# Patient Record
Sex: Female | Born: 1960
Health system: Southern US, Community
[De-identification: ages and names within clinical notes are randomized; demographics above are authoritative.]

## PROBLEM LIST (undated history)

## (undated) DIAGNOSIS — F329 Major depressive disorder, single episode, unspecified: Secondary | ICD-10-CM

## (undated) DIAGNOSIS — F32A Depression, unspecified: Secondary | ICD-10-CM

## (undated) DIAGNOSIS — E7800 Pure hypercholesterolemia, unspecified: Secondary | ICD-10-CM

## (undated) DIAGNOSIS — F99 Mental disorder, not otherwise specified: Secondary | ICD-10-CM

## (undated) DIAGNOSIS — D649 Anemia, unspecified: Secondary | ICD-10-CM

## (undated) DIAGNOSIS — M1990 Unspecified osteoarthritis, unspecified site: Secondary | ICD-10-CM

## (undated) HISTORY — DX: Pure hypercholesterolemia, unspecified: E78.00

## (undated) HISTORY — DX: Anemia, unspecified: D64.9

## (undated) HISTORY — PX: HERNIA REPAIR: SHX51

## (undated) HISTORY — PX: ENDOMETRIAL ABLATION: SHX621

## (undated) HISTORY — DX: Mental disorder, not otherwise specified: F99

---

## 2017-11-06 DIAGNOSIS — R109 Unspecified abdominal pain: Secondary | ICD-10-CM | POA: Diagnosis not present

## 2017-11-19 ENCOUNTER — Other Ambulatory Visit (HOSPITAL_COMMUNITY)
Admission: RE | Admit: 2017-11-19 | Discharge: 2017-11-19 | Disposition: A | Discharge status: Home / Self Care | Payer: BLUE CROSS/BLUE SHIELD | Source: Ambulatory Visit | Attending: Obstetrics & Gynecology | Admitting: Obstetrics & Gynecology

## 2017-11-19 ENCOUNTER — Other Ambulatory Visit: Payer: Self-pay | Admitting: Obstetrics & Gynecology

## 2017-11-19 ENCOUNTER — Ambulatory Visit: Payer: BLUE CROSS/BLUE SHIELD | Admitting: Obstetrics & Gynecology

## 2017-11-19 ENCOUNTER — Encounter: Payer: Self-pay | Admitting: Obstetrics & Gynecology

## 2017-11-19 ENCOUNTER — Other Ambulatory Visit: Payer: Self-pay

## 2017-11-19 ENCOUNTER — Encounter (INDEPENDENT_AMBULATORY_CARE_PROVIDER_SITE_OTHER): Payer: Self-pay

## 2017-11-19 VITALS — BP 135/71 | HR 88 | Ht 64.0 in | Wt 260.0 lb

## 2017-11-19 DIAGNOSIS — N924 Excessive bleeding in the premenopausal period: Secondary | ICD-10-CM

## 2017-11-19 DIAGNOSIS — N939 Abnormal uterine and vaginal bleeding, unspecified: Secondary | ICD-10-CM | POA: Diagnosis not present

## 2017-11-19 DIAGNOSIS — N938 Other specified abnormal uterine and vaginal bleeding: Secondary | ICD-10-CM | POA: Diagnosis not present

## 2017-11-19 DIAGNOSIS — Z124 Encounter for screening for malignant neoplasm of cervix: Secondary | ICD-10-CM | POA: Insufficient documentation

## 2017-11-19 LAB — POCT HEMOGLOBIN: Hemoglobin: 12.9 g/dL (ref 12.2–16.2)

## 2017-11-19 MED ORDER — MEGESTROL ACETATE 40 MG PO TABS: ORAL_TABLET | ORAL | 3 refills | Status: DC

## 2017-11-19 NOTE — Addendum Note (Signed)
Addended by: Gaylyn Rong A on: 11/19/2017 11:57 AM   Modules accepted: Orders

## 2017-11-19 NOTE — Progress Notes (Signed)
Chief Complaint  Patient presents with  . Vaginal Bleeding    for 1.27yrs      57 y.o. G3P3 No LMP recorded. Patient has had an ablation. The current method of family planning is tubal ligation.  Outpatient Encounter Medications as of 11/19/2017  Medication Sig  . BELVIQ 10 MG TABS Take 1 tablet by mouth daily.  . ferrous sulfate 325 (65 FE) MG tablet Take 325 mg by mouth daily with breakfast.  . ibuprofen (ADVIL,MOTRIN) 200 MG tablet Take 200 mg by mouth every 6 (six) hours as needed.  . venlafaxine (EFFEXOR) 75 MG tablet TAKE 1 TABLET BY MOUTH TWICE DAILY WITH FOOD FOR 90 DAYS  . megestrol (MEGACE) 40 MG tablet 3 tablets a day for 5 days, 2 tablets a day for 5 days then 1 tablet daily   No facility-administered encounter medications on file as of 11/19/2017.     Subjective Brandy Rivera is s/p endometrial ablation 2004ish has been bleeding for the past 18 months daily heavy clots, brown red pink  No cramps Nothing really makes it worse or better Has not been evaluated at all for this Past Medical History:  Diagnosis Date  . Anemia   . Mental disorder    depression    Past Surgical History:  Procedure Laterality Date  . CESAREAN SECTION    . ENDOMETRIAL ABLATION    . HERNIA REPAIR      OB History    Gravida  3   Para  3   Term      Preterm      AB      Living  2     SAB      TAB      Ectopic      Multiple      Live Births              No Known Allergies  Social History   Socioeconomic History  . Marital status: Divorced    Spouse name: Not on file  . Number of children: Not on file  . Years of education: Not on file  . Highest education level: Not on file  Occupational History  . Not on file  Social Needs  . Financial resource strain: Not on file  . Food insecurity:    Worry: Not on file    Inability: Not on file  . Transportation needs:    Medical: Not on file    Non-medical: Not on file  Tobacco Use  . Smoking status:  Current Every Day Smoker    Packs/day: 0.50    Types: Cigarettes  . Smokeless tobacco: Never Used  Substance and Sexual Activity  . Alcohol use: Not Currently  . Drug use: Never  . Sexual activity: Not Currently    Birth control/protection: Surgical  Lifestyle  . Physical activity:    Days per week: Not on file    Minutes per session: Not on file  . Stress: Not on file  Relationships  . Social connections:    Talks on phone: Not on file    Gets together: Not on file    Attends religious service: Not on file    Active member of club or organization: Not on file    Attends meetings of clubs or organizations: Not on file    Relationship status: Not on file  Other Topics Concern  . Not on file  Social History Narrative  . Not on file  Family History  Problem Relation Age of Onset  . Cancer Father        stomach  . Hypertension Mother   . Diabetes Brother   . Hypertension Sister     Medications:       Current Outpatient Medications:  .  BELVIQ 10 MG TABS, Take 1 tablet by mouth daily., Disp: , Rfl: 3 .  ferrous sulfate 325 (65 FE) MG tablet, Take 325 mg by mouth daily with breakfast., Disp: , Rfl:  .  ibuprofen (ADVIL,MOTRIN) 200 MG tablet, Take 200 mg by mouth every 6 (six) hours as needed., Disp: , Rfl:  .  venlafaxine (EFFEXOR) 75 MG tablet, TAKE 1 TABLET BY MOUTH TWICE DAILY WITH FOOD FOR 90 DAYS, Disp: , Rfl: 2 .  megestrol (MEGACE) 40 MG tablet, 3 tablets a day for 5 days, 2 tablets a day for 5 days then 1 tablet daily, Disp: 45 tablet, Rfl: 3  Objective Blood pressure 135/71, pulse 88, height 5\' 4"  (1.626 m), weight 260 lb (117.9 kg).  General WDWN female NAD Vulva:  normal appearing vulva with no masses, tenderness or lesions Vagina:  no lesions or discharge noted, no mucosa, no discharge, no  lesions, blood in the vault Cervix:  no cervical motion tenderness and no lesions Uterus:  Cant evaluate due to cwntral obesity Adnexa: ovaries:present,      Pertinent ROS No burning with urination, frequency or urgency No nausea, vomiting or diarrhea Nor fever chills or other constitutional symptoms   Labs or studies Endometrial Biopsy Procedure Note  Pre-operative Diagnosis: perimenopausal DUB  Post-operative Diagnosis: same  Indications: abnormal uterine bleeding  Procedure Details   Urine pregnancy test was not done.  The risks (including infection, bleeding, pain, and uterine perforation) and benefits of the procedure were explained to the patient and Written informed consent was obtained.  Antibiotic prophylaxis against endocarditis was not indicated.   The patient was placed in the dorsal lithotomy position.  Bimanual exam showed the uterus to be in the neutral position.  A Graves' speculum inserted in the vagina, and the cervix prepped with povidone iodine.  Endocervical curettage with a Kevorkian curette was not performed.   A sharp tenaculum was applied to the anterior lip of the cervix for stabilization.  A sterile uterine sound was used to sound the uterus to a depth of 8.5cm.  A Pipelle endometrial aspirator was used to sample the endometrium.  Sample was sent for pathologic examination.  Condition: Stable  Complications: None  Plan:  The patient was advised to call for any fever or for prolonged or severe pain or bleeding. She was advised to use OTC ibuprofen as needed for mild to moderate pain. She was advised to avoid vaginal intercourse for 48 hours or until the bleeding has completely stopped.  Attending Physician Documentation: I was present for or performed the following: endometrial biopsy     Impression Diagnoses this Encounter::   ICD-10-CM   1. Abnormal perimenopausal bleeding N92.4 US PELVIS (TRANSABDOMINAL ONLY)    US PELVIS TRANSVANGINAL NON-OB (TV ONLY)  2. DUB (dysfunctional uterine bleeding) N93.8 US PELVIS (TRANSABDOMINAL ONLY)    US PELVIS TRANSVANGINAL NON-OB (TV ONLY)    Established  relevant diagnosis(es): obesity  Plan/Recommendations: Meds ordered this encounter  Medications  . megestrol (MEGACE) 40 MG tablet    Sig: 3 tablets a day for 5 days, 2 tablets a day for 5 days then 1 tablet daily    Dispense:  45 tablet  Refill:  3    Labs or Scans Ordered: Orders Placed This Encounter  Procedures  . US PELVIS (TRANSABDOMINAL ONLY)  . US PELVIS TRANSVANGINAL NON-OB (TV ONLY)    Management:: EMB today Sonogram 2 weeks Megestrol algorithm  Follow up Return in about 2 weeks (around 12/03/2017) for GYN sono, Follow up, with Dr Elonda Husky.        All questions were answered.

## 2017-11-20 LAB — CYTOLOGY - PAP
DIAGNOSIS: NEGATIVE
HPV (WINDOPATH): NOT DETECTED

## 2017-12-03 ENCOUNTER — Ambulatory Visit (INDEPENDENT_AMBULATORY_CARE_PROVIDER_SITE_OTHER): Payer: BLUE CROSS/BLUE SHIELD

## 2017-12-03 ENCOUNTER — Ambulatory Visit: Payer: BLUE CROSS/BLUE SHIELD | Admitting: Obstetrics & Gynecology

## 2017-12-03 ENCOUNTER — Other Ambulatory Visit: Payer: BLUE CROSS/BLUE SHIELD

## 2017-12-03 DIAGNOSIS — N924 Excessive bleeding in the premenopausal period: Secondary | ICD-10-CM

## 2017-12-03 DIAGNOSIS — N938 Other specified abnormal uterine and vaginal bleeding: Secondary | ICD-10-CM

## 2017-12-03 NOTE — Progress Notes (Signed)
PELVIC US TA/TV: enlarged heterogeneous anteverted uterus,? Adenomyosis,anterior myometrial bulkiness w/linear striations vs fibroid 10.4 x 6.6 x 11 cm,posterior fibroid 5.1 x 3.3 x 5.5 cm,mult simple nabothian cyst,EEC 11 mm,unable to visualized ovaries,no free fluid,no pain during ultrasound

## 2017-12-07 ENCOUNTER — Encounter: Payer: Self-pay | Admitting: Obstetrics & Gynecology

## 2017-12-07 ENCOUNTER — Ambulatory Visit: Payer: BLUE CROSS/BLUE SHIELD | Admitting: Obstetrics & Gynecology

## 2017-12-07 VITALS — BP 115/66 | HR 75 | Ht 64.0 in | Wt 257.0 lb

## 2017-12-07 DIAGNOSIS — N950 Postmenopausal bleeding: Secondary | ICD-10-CM | POA: Diagnosis not present

## 2017-12-07 MED ORDER — PROGESTERONE MICRONIZED 200 MG PO CAPS: 200.0000 mg | ORAL_CAPSULE | Freq: Every day | ORAL | 11 refills | Status: DC

## 2017-12-07 NOTE — Progress Notes (Signed)
Follow up appointment for results  Chief Complaint  Patient presents with  . Follow-up    ultrasound on Monday/ biopsy result    Blood pressure 115/66, pulse 75, height 5\' 4"  (1.626 m), weight 257 lb (116.6 kg).  GYNECOLOGIC SONOGRAM   Brandy Rivera is a 57 y.o. G3P3 unknown,LMP she is here for a pelvic sonogram for dysfunctional uterine bleeding.  Uterus                      15.4 x 11.7 x 12.4 cm, Vol 1165.0 ml,enlarged heterogeneous anteverted uterus,? Adenomyosis,anterior myometrial bulkiness w/linear striations vs fibroid 10.4 x 6.6 x 11 cm,posterior fibroid 5.1 x 3.3 x 5.5 cm  Endometrium          11 mm, symmetrical, wnl  Right ovary             Not visualized, adnexa wnl  Left ovary               Not visualized ,adnexa wnl  Mult simple nabothian cyst  Technician Comments:  PELVIC US TA/TV: enlarged heterogeneous anteverted uterus,? Adenomyosis,anterior myometrial bulkiness w/linear striations vs fibroid 10.4 x 6.6 x 11 cm,posterior fibroid 5.1 x 3.3 x 5.5 cm,mult simple nabothian cyst,EEC 11 mm,unable to visualized ovaries,no free fluid,no pain during ultrasound    [employer] 12/03/2017 5:26 PM    MEDS ordered this encounter: Meds ordered this encounter  Medications  . progesterone (PROMETRIUM) 200 MG capsule    Sig: Take 1 capsule (200 mg total) by mouth daily.    Dispense:  30 capsule    Refill:  11    Orders for this encounter: No orders of the defined types were placed in this encounter.   Impression: PMB (postmenopausal bleeding)    Plan: Stop megestrol in 10 days or so Pt is getting stimulation probably from peripheral fat to the endometrium Will "cycle" with prometrium 200 mg for 10 days a month every month, bleeding or not to counteract the estrogenic stimulation  Follow Up: Return in about 1 year (around 12/08/2018) for Follow up, with Dr Elonda Husky.       Face to face time:  10 minutes  Greater than 50% of the visit time was spent  in counseling and coordination of care with the patient.  The summary and outline of the counseling and care coordination is summarized in the note above.   All questions were answered.  Past Medical History:  Diagnosis Date  . Anemia   . Mental disorder    depression    Past Surgical History:  Procedure Laterality Date  . CESAREAN SECTION    . ENDOMETRIAL ABLATION    . HERNIA REPAIR      OB History    Gravida  3   Para  3   Term      Preterm      AB      Living  2     SAB      TAB      Ectopic      Multiple      Live Births              No Known Allergies  Social History   Socioeconomic History  . Marital status: Divorced    Spouse name: Not on file  . Number of children: Not on file  . Years of education: Not on file  . Highest education level: Not on file  Occupational History  . Not  on file  Social Needs  . Financial resource strain: Not on file  . Food insecurity:    Worry: Not on file    Inability: Not on file  . Transportation needs:    Medical: Not on file    Non-medical: Not on file  Tobacco Use  . Smoking status: Current Every Day Smoker    Packs/day: 0.50    Types: Cigarettes  . Smokeless tobacco: Never Used  Substance and Sexual Activity  . Alcohol use: Not Currently  . Drug use: Never  . Sexual activity: Not Currently    Birth control/protection: Surgical  Lifestyle  . Physical activity:    Days per week: Not on file    Minutes per session: Not on file  . Stress: Not on file  Relationships  . Social connections:    Talks on phone: Not on file    Gets together: Not on file    Attends religious service: Not on file    Active member of club or organization: Not on file    Attends meetings of clubs or organizations: Not on file    Relationship status: Not on file  Other Topics Concern  . Not on file  Social History Narrative  . Not on file    Family History  Problem Relation Age of Onset  . Cancer Father         stomach  . Hypertension Mother   . Diabetes Brother   . Hypertension Sister

## 2017-12-16 ENCOUNTER — Encounter: Payer: Self-pay | Admitting: Obstetrics and Gynecology

## 2018-01-31 ENCOUNTER — Ambulatory Visit: Payer: BLUE CROSS/BLUE SHIELD | Admitting: Obstetrics & Gynecology

## 2018-01-31 ENCOUNTER — Encounter: Payer: Self-pay | Admitting: Obstetrics & Gynecology

## 2018-01-31 VITALS — BP 137/72 | HR 95 | Ht 64.0 in | Wt 257.5 lb

## 2018-01-31 DIAGNOSIS — N950 Postmenopausal bleeding: Secondary | ICD-10-CM

## 2018-01-31 DIAGNOSIS — N921 Excessive and frequent menstruation with irregular cycle: Secondary | ICD-10-CM

## 2018-01-31 DIAGNOSIS — D250 Submucous leiomyoma of uterus: Secondary | ICD-10-CM | POA: Diagnosis not present

## 2018-01-31 DIAGNOSIS — D252 Subserosal leiomyoma of uterus: Secondary | ICD-10-CM

## 2018-01-31 DIAGNOSIS — D251 Intramural leiomyoma of uterus: Secondary | ICD-10-CM | POA: Diagnosis not present

## 2018-01-31 MED ORDER — MEGESTROL ACETATE 40 MG PO TABS: ORAL_TABLET | ORAL | 3 refills | Status: DC

## 2018-01-31 NOTE — Addendum Note (Signed)
Addended by: Florian Buff on: 01/31/2018 03:25 PM   Modules accepted: Orders

## 2018-01-31 NOTE — Progress Notes (Addendum)
Chief Complaint  Patient presents with  . vaginal bleeding continues      57 y.o. [id number] No LMP recorded. Patient has had an ablation. The current method of family planning is tubal ligation.  Outpatient Encounter Medications as of 01/31/2018  Medication Sig  . BELVIQ 10 MG TABS Take 1 tablet by mouth daily.  Marland Kitchen ibuprofen (ADVIL,MOTRIN) 200 MG tablet Take 200 mg by mouth every 6 (six) hours as needed.  . megestrol (MEGACE) 40 MG tablet 3 tablets a day for 5 days, 2 tablets a day for 5 days then 1 tablet daily  . venlafaxine (EFFEXOR) 75 MG tablet TAKE 1 TABLET BY MOUTH TWICE DAILY WITH FOOD FOR 90 DAYS  . megestrol (MEGACE) 40 MG tablet 3 tablets a day  . [DISCONTINUED] ferrous sulfate 325 (65 FE) MG tablet Take 325 mg by mouth daily with breakfast.  . [DISCONTINUED] progesterone (PROMETRIUM) 200 MG capsule Take 1 capsule (200 mg total) by mouth daily.   No facility-administered encounter medications on file as of 01/31/2018.     Subjective Pt is back in for follow up She is having continued daily bleeding with clots and heavy cramps despite megestrol and now cyclical prometrium neither of which ahs been effective She is s/o ablation also She has 14-16 weeks fibroids which is evidently the underlying reason we cannot control her bleeding Past Medical History:  Diagnosis Date  . Anemia   . Mental disorder    depression    Past Surgical History:  Procedure Laterality Date  . CESAREAN SECTION    . ENDOMETRIAL ABLATION    . HERNIA REPAIR      OB History    Gravida  3   Para  3   Term  3   Preterm      AB      Living  2     SAB      TAB      Ectopic      Multiple      Live Births  2           No Known Allergies  Social History   Socioeconomic History  . Marital status: Divorced    Spouse name: Not on file  . Number of children: Not on file  . Years of education: Not on file  . Highest education level: Not on file  Occupational History   . Not on file  Social Needs  . Financial resource strain: Not on file  . Food insecurity:    Worry: Not on file    Inability: Not on file  . Transportation needs:    Medical: Not on file    Non-medical: Not on file  Tobacco Use  . Smoking status: Current Every Day Smoker    Packs/day: 0.50    Types: Cigarettes  . Smokeless tobacco: Never Used  Substance and Sexual Activity  . Alcohol use: Not Currently  . Drug use: Never  . Sexual activity: Not Currently    Birth control/protection: Surgical    Comment: ablation  Lifestyle  . Physical activity:    Days per week: Not on file    Minutes per session: Not on file  . Stress: Not on file  Relationships  . Social connections:    Talks on phone: Not on file    Gets together: Not on file    Attends religious service: Not on file    Active member of club or organization: Not on file  Attends meetings of clubs or organizations: Not on file    Relationship status: Not on file  Other Topics Concern  . Not on file  Social History Narrative  . Not on file    Family History  Problem Relation Age of Onset  . Cancer Father        stomach  . Hypertension Mother   . Diabetes Brother   . Hypertension Sister   . Miscarriages / [place] Daughter     Medications:       Current Outpatient Medications:  .  BELVIQ 10 MG TABS, Take 1 tablet by mouth daily., Disp: , Rfl: 3 .  ibuprofen (ADVIL,MOTRIN) 200 MG tablet, Take 200 mg by mouth every 6 (six) hours as needed., Disp: , Rfl:  .  megestrol (MEGACE) 40 MG tablet, 3 tablets a day for 5 days, 2 tablets a day for 5 days then 1 tablet daily, Disp: 45 tablet, Rfl: 3 .  venlafaxine (EFFEXOR) 75 MG tablet, TAKE 1 TABLET BY MOUTH TWICE DAILY WITH FOOD FOR 90 DAYS, Disp: , Rfl: 2 .  megestrol (MEGACE) 40 MG tablet, 3 tablets a day, Disp: 90 tablet, Rfl: 3  Objective Blood pressure 137/72, pulse 95, height 5\' 4"  (1.626 m), weight 257 lb 8 oz (116.8 kg).  Gen WDWN NAD  Pertinent  ROS No burning with urination, frequency or urgency No nausea, vomiting or diarrhea Nor fever chills or other constitutional symptoms   Labs or studies Reviewed imaging and labs    Impression Diagnoses this Encounter::   ICD-10-CM   1. Intramural, submucous, and subserous leiomyoma of uterus D25.1    D25.0    D25.2   2. Menometrorrhagia N92.1   3. PMB (postmenopausal bleeding) N95.0     Established relevant diagnosis(es):   Plan/Recommendations: Meds ordered this encounter  Medications  . megestrol (MEGACE) 40 MG tablet    Sig: 3 tablets a day    Dispense:  90 tablet    Refill:  3    Labs or Scans Ordered: No orders of the defined types were placed in this encounter.   Management:: >pt has had 19 months now of almost continuous bleeding She has 2 enlarged myomata making the uterus 146-16 weeks size overall Endometrium is normal otherwise She is s/p endometrial ablation 2004 and has not responded to aggressive progestational therapy As a result recommend Supracervical hysterectomy with removal of both tubes and ovaries 02/20/2018  Follow up Return in about 4 weeks (around 02/28/2018) for [place], with Dr Elonda Husky.    All questions were answered.

## 2018-02-11 ENCOUNTER — Telehealth: Payer: Self-pay | Admitting: Obstetrics & Gynecology

## 2018-02-11 ENCOUNTER — Encounter: Payer: Self-pay | Admitting: *Deleted

## 2018-02-11 NOTE — Telephone Encounter (Signed)
Needs note to be out of work 02-20-10-10-2017/ pt is not eligible for [facility]

## 2018-02-11 NOTE — Patient Instructions (Signed)
Brandy Rivera  02/11/2018     @PREFPERIOPPHARMACY @   Your procedure is scheduled on  02/20/2018   Report to [facility] at  830  A.M.  Call this number if you have problems the morning of surgery:  [phone]   Remember:  Do not eat or drink after midnight.                       Take these medicines the morning of surgery with A SIP OF WATER  effexor.    Do not wear jewelry, make-up or nail polish.  Do not wear lotions, powders, or perfumes, or deodorant.  Do not shave 48 hours prior to surgery.  Men may shave face and neck.  Do not bring valuables to the hospital.  [facility] is not responsible for any belongings or valuables.  Contacts, dentures or bridgework may not be worn into surgery.  Leave your suitcase in the car.  After surgery it may be brought to your room.  For patients admitted to the hospital, discharge time will be determined by your treatment team.  Patients discharged the day of surgery will not be allowed to drive home.   Name and phone number of your driver:   family Special instructions:  None  Please read over the following fact sheets that you were given. Pain Booklet, Coughing and Deep Breathing, Blood Transfusion Information, Lab Information, Surgical Site Infection Prevention, Anesthesia Post-op Instructions and Care and Recovery After Surgery       Bilateral Salpingo-Oophorectomy Bilateral salpingo-oophorectomy is the surgical removal of both fallopian tubes and both ovaries. The ovaries are reproductive organs that produce eggs in women. The fallopian tubes allow eggs to move from the ovaries to the uterus. You may need this procedure if you:  Have had your uterus removed. This procedure is usually done after the uterus is removed.  Have cancer of the fallopian tubes or ovaries.  Have a high risk of cancer of the fallopian tubes or ovaries.  There are three different techniques that can be used for this  procedure:  Open. One large incision will be made in your abdomen.  Laparoscopic. A thin, lighted tube with a small camera on the end (laparoscope) will be used to help perform the procedure. The laparoscope will allow your surgeon to make several small incisions in the abdomen instead of one large incision.  Robot-assisted. A computer will be used to control surgical instruments that are attached to robotic arms. A laparoscope may also be used with this technique.  As a result of this procedure, you will become sterile (unable to become pregnant), and you will go into menopause (no longer able to have menstrual periods). You may develop symptoms of menopause such as hot flashes, night sweats, and mood changes. Your sex drive may also be affected. Tell a health care provider about:  Any allergies you have.  All medicines you are taking, including vitamins, herbs, eye drops, creams, and over-the-counter medicines.  Any problems you or family members have had with anesthetic medicines.  Any blood disorders you have.  Any surgeries you have had.  Any medical conditions you have.  Whether you are pregnant or may be pregnant. What are the risks? Generally, this is a safe procedure. However, problems may occur, including:  Infection.  Bleeding.  Allergic reactions to medicines.  Damage to other structures or organs.  Blood clots  in the legs or lungs.  What happens before the procedure? Staying hydrated Follow instructions from your health care provider about hydration, which may include:  Up to 2 hours before the procedure - you may continue to drink clear liquids, such as water, clear fruit juice, black coffee, and plain tea.  Eating and drinking restrictions Follow instructions from your health care provider about eating and drinking, which may include:  8 hours before the procedure - stop eating heavy meals or foods such as meat, fried foods, or fatty foods.  6 hours  before the procedure - stop eating light meals or foods, such as toast or cereal.  6 hours before the procedure - stop drinking milk or drinks that contain milk.  2 hours before the procedure - stop drinking clear liquids.  Medicines  Ask your health care provider about: ? Changing or stopping your regular medicines. This is especially important if you are taking diabetes medicines or blood thinners. ? Taking medicines such as aspirin and ibuprofen. These medicines can thin your blood. Do not take these medicines before your procedure if your health care provider instructs you not to.  You may be given antibiotic medicine to help prevent infection. General instructions  Do not smoke for at least 2 weeks before your procedure or as told by your health care provider.  You may have an exam or testing.  You may have a blood or urine sample taken.  Ask your health care provider how your surgical site will be marked or identified.  Plan to have someone take you home from the hospital.  If you will be going home right after the procedure, plan to have someone with you for 24 hours. What happens during the procedure?  To reduce your risk of infection: ? Your health care team will wash or sanitize their hands. ? Your skin will be washed with soap. ? Hair may be removed from the surgical area.  An IV tube will be inserted into one of your veins.  You will be given one or more of the following: ? A medicine to help you relax (sedative). ? A medicine to make you fall asleep (general anesthetic).  A thin tube (catheter) will be inserted through your urethra and into your bladder. The catheter drains urine during your procedure.  Depending on the type of surgery you are having, your surgeon will do one of the following: ? Make one incision in your abdomen (open surgery). ? Make two small incisions in your abdomen (laparoscopic surgery). The laparoscope will be passed through one  incision, and surgical instruments will be passed through the other. ? Make several small incisions in your abdomen (robot-assisted surgery). A laparoscope and other surgical instruments may be passed through the incisions.  Your fallopian tubes and ovaries will be cut away from the uterus and removed.  Your blood vessels will be clamped and tied to prevent too much bleeding.  The incision(s) in your abdomen will be closed with stitches (sutures) or staples.  A bandage (dressing) may be placed over your incision(s). The procedure may vary among health care providers and hospitals. What happens after the procedure?  Your blood pressure, heart rate, breathing rate, and blood oxygen level will be monitored until the medicines you were given have worn off.  You may continue to receive fluids and medicines through an IV tube.  You may continue to have a catheter draining your urine.  You may have to wear compression stockings. These stockings  help to prevent blood clots and reduce swelling in your legs.  You will be given pain medicine as needed.  Do not drive for 24 hours if you received a sedative. Summary  Bilateral salpingo-oophorectomy is a procedure to remove both fallopian tubes and both ovaries.  There are three different techniques that can be used for this procedure, including open, laparoscopic, and robotic. Talk with your health care provider about how your procedure will be done.  As a result of this procedure, you will become sterile and you will go into menopause.  Plan to have someone take you home from the hospital. This information is not intended to replace advice given to you by your health care provider. Make sure you discuss any questions you have with your health care provider. Document Released: 04/17/2005 Document Revised: 05/22/2016 Document Reviewed: 05/22/2016 Elsevier Interactive Patient Education  2018 [place].  Bilateral Salpingo-Oophorectomy, Care  After This sheet gives you information about how to care for yourself after your procedure. Your health care provider may also give you more specific instructions. If you have problems or questions, contact your health care provider. What can I expect after the procedure? After the procedure, it is common to have:  Abdominal pain.  Some occasional vaginal bleeding (spotting).  Tiredness.  Symptoms of menopause, such as hot flashes, night sweats, or mood swings.  Follow these instructions at home: Incision care  Keep your incision area and your bandage (dressing) clean and dry.  Follow instructions from your health care provider about how to take care of your incision. Make sure you: ? Wash your hands with soap and water before you change your dressing. If soap and water are not available, use hand sanitizer. ? Change your dressing as told by your health care provider. ? Leave stitches (sutures), staples, skin glue, or adhesive strips in place. These skin closures may need to stay in place for 2 weeks or longer. If adhesive strip edges start to loosen and curl up, you may trim the loose edges. Do not remove adhesive strips completely unless your health care provider tells you to do that.  Check your incision area every day for signs of infection. Check for: ? Redness, swelling, or pain. ? Fluid or blood. ? Warmth. ? Pus or a bad smell. Activity  Do not drive or use heavy machinery while taking prescription pain medicine.  Do not drive for 24 hours if you received a medicine to help you relax (sedative) during your procedure.  Take frequent, short walks throughout the day. Rest when you get tired. Ask your health care provider what activities are safe for you.  Avoid activity that requires great effort. Also, avoid heavy lifting. Do not lift anything that is heavier than 10 lbs. (4.5 kg), or the limit that your health care provider tells you, until he or she says that it is safe to  do so.  Do not douche, use tampons, or have sex until your health care provider approves. General instructions  To prevent or treat constipation while you are taking prescription pain medicine, your health care provider may recommend that you: ? Drink enough fluid to keep your urine clear or pale yellow. ? Take over-the-counter or prescription medicines. ? Eat foods that are high in fiber, such as fresh fruits and vegetables, whole grains, and beans. ? Limit foods that are high in fat and processed sugars, such as fried and sweet foods.  Take over-the-counter and prescription medicines only as told by your  health care provider.  Do not take baths, swim, or use a hot tub until your health care provider approves. Ask your health care provider if you can take showers. You may only be allowed to take sponge baths for bathing.  Wear compression stockings as told by your health care provider. These stockings help to prevent blood clots and reduce swelling in your legs.  Keep all follow-up visits as told by your health care provider. This is important. Contact a health care provider if:  You have pain when you urinate.  You have pus or a bad smelling discharge coming from your vagina.  You have redness, swelling, or pain around your incision.  You have fluid or blood coming from your incision.  Your incision feels warm to the touch.  You have pus or a bad smell coming from your incision.  You have a fever.  Your incision starts to break open.  You have pain in the abdomen, and it gets worse or does not get better when you take medicine.  You develop a rash.  You develop nausea and vomiting.  You feel lightheaded. Get help right away if:  You develop pain in your chest or leg.  You become short of breath.  You faint.  You have increased bleeding from your vagina. Summary  After the procedure, it is common to have pain, bleeding in the vagina, and symptoms of  menopause.  Follow instructions from your health care provider about how to take care of your incision.  Follow instructions from your health care provider about activities and restrictions.  Check your incision every day for signs of infection and report any symptoms to your health care provider. This information is not intended to replace advice given to you by your health care provider. Make sure you discuss any questions you have with your health care provider. Document Released: 04/17/2005 Document Revised: 05/22/2016 Document Reviewed: 05/22/2016 Elsevier Interactive Patient Education  2018 [place] Hysterectomy A supracervical hysterectomy is surgery to remove the top part of the uterus, but not the cervix. You will no longer have menstrual periods or be able to get pregnant after this surgery. The fallopian tubes and ovaries may also be removed (bilateral salpingo-oophorectomy) during this surgery. This surgery is usually performed using a minimally invasive technique called laparoscopy. This technique allows the surgery to be done through small incisions. The minimally invasive technique provides benefits such as less pain, less risk of infection, and shorter recovery time. Tell a health care provider about:  Any allergies you have.  All medicines you are taking, including vitamins, herbs, eye drops, creams, and over-the-counter medicines.  Any problems you or family members have had with anesthetic medicines.  Any blood disorders you have.  Any surgeries you have had.  Any medical conditions you have. What are the risks? Generally, this is a safe procedure. However, as with any procedure, complications can occur. Possible complications include:  Bleeding.  Blood clots in the legs or lung.  Infection.  Injury to surrounding organs.  Problems related to anesthesia.  Conversion to an open abdominal surgery.  Additional surgery later to remove the  cervix if you have problems with the cervix.  What happens before the procedure?  Ask your health care provider about changing or stopping your regular medicines.  Do not take aspirin or blood thinners (anticoagulants) for 1 week before the surgery, or as directed by your health care provider.  Do not eat or drink anything for 8  hours before the surgery, or as directed by your health care provider.  Quit smoking if you smoke.  Arrange for a ride home after surgery and for someone to help you at home during recovery. What happens during the procedure?  You will be given an antibiotic medicine.  An IV tube will be placed in one of your veins. You will be given medicine to make you sleep (general anesthetic).  A gas (carbon dioxide) will be used to inflate your abdomen. This will allow your surgeon to look inside your abdomen, perform your surgery, and treat any other problems found if necessary.  Three or four small incisions will be made in your abdomen. One of these incisions will be made in the area of your belly button (navel). A thin, flexible tube with a tiny camera and light on the end of it (laparoscope) will be inserted into the incision. The camera on the laparoscope sends a picture to a TV screen in the operating room. This gives your surgeon a good view inside the abdomen.  Other surgical instruments will be inserted through the other incisions.  The uterus will be cut into small pieces and removed through the small incisions.  Your incisions will be closed. What happens after the procedure?  You will be taken to a recovery area where your progress will be monitored until you are awake, stable, and taking fluids well. If there are no other problems, you will then be moved to a regular hospital room, or you will be allowed to go home.  You will likely have minimal discomfort after the surgery because the incisions are so small with the laparoscopic technique.  You will be  given pain medicine while you are in the hospital and for when you go home.  If a bilateral salpingo-oophorectomy was performed before menopause, you will go through a sudden (abrupt) menopause. This can be helped with hormone medicines. This information is not intended to replace advice given to you by your health care provider. Make sure you discuss any questions you have with your health care provider. Document Released: 10/04/2007 Document Revised: 09/23/2015 Document Reviewed: 10/18/2012 Elsevier Interactive Patient Education  2018 [place] Hysterectomy, Care After Refer to this sheet in the next few weeks. These instructions provide you with information on caring for yourself after your procedure. Your health care provider may also give you more specific instructions. Your treatment has been planned according to current medical practices, but problems sometimes occur. Call your health care provider if you have any problems or questions after your procedure. What can I expect after the procedure? After your procedure, it is typical to have some discomfort, tenderness, swelling, and bruising at the surgical sites. This normally lasts for about 2 weeks. Follow these instructions at home:  Get plenty of rest and sleep.  Only take over-the-counter or prescription medicines as directed by your health care provider.  Do not take aspirin. It can cause bleeding.  Do not drive until your health care provider approves.  Follow your health care provider's advice regarding exercise, lifting, and general activities.  Resume your usual diet as directed by your health care provider.  Do not douche, use tampons, or have sexual intercourse for at least 6 weeks or until your health care provider gives you permission.  Change your bandages (dressings) only as directed by your health care provider.  Monitor your temperature.  Take showers instead of baths for 2-3 weeks or as  directed by your health  care provider.  Drink enough fluids to keep your urine clear or pale yellow.  Do not drink alcohol until your health care provider gives you permission.  If you are constipated, you may take a mild laxative if your health care provider approves. Bran foods may also help with constipation problems.  Try to have someone home with you for 1-2 weeks to help with activities.  Follow up with your health care provider as directed. Contact a health care provider if:  You have swelling, redness, or increasing pain in the incision area.  You have pus coming from an incision.  You notice a bad smell coming from the incision or dressing.  You have swelling, redness, or pain in the area around the IV site.  Your incision breaks open.  You feel dizzy or lightheaded.  You have pain or bleeding when you urinate.  You have persistent diarrhea.  You have persistent nausea and vomiting.  You have abnormal vaginal discharge.  You have a rash.  Your pain is not controlled with your prescribed medicine. Get help right away if:  You have a fever.  You have severe abdominal pain.  You have chest pain.  You have shortness of breath.  You faint.  You have pain, swelling, or redness in your leg.  You have heavy vaginal bleeding with blood clots. This information is not intended to replace advice given to you by your health care provider. Make sure you discuss any questions you have with your health care provider. Document Released: 02/05/2013 Document Revised: 09/23/2015 Document Reviewed: 10/18/2012 Elsevier Interactive Patient Education  2018 [place] Anesthesia, Adult General anesthesia is the use of medicines to make a person "go to sleep" (be unconscious) for a medical procedure. General anesthesia is often recommended when a procedure:  Is long.  Requires you to be still or in an unusual position.  Is major and can cause you to lose  blood.  Is impossible to do without general anesthesia.  The medicines used for general anesthesia are called general anesthetics. In addition to making you sleep, the medicines:  Prevent pain.  Control your blood pressure.  Relax your muscles.  Tell a health care provider about:  Any allergies you have.  All medicines you are taking, including vitamins, herbs, eye drops, creams, and over-the-counter medicines.  Any problems you or family members have had with anesthetic medicines.  Types of anesthetics you have had in the past.  Any bleeding disorders you have.  Any surgeries you have had.  Any medical conditions you have.  Any history of heart or lung conditions, such as heart failure, sleep apnea, or chronic obstructive pulmonary disease (COPD).  Whether you are pregnant or may be pregnant.  Whether you use tobacco, alcohol, marijuana, or street drugs.  Any history of Armed forces logistics/support/administrative officer.  Any history of depression or anxiety. What are the risks? Generally, this is a safe procedure. However, problems may occur, including:  Allergic reaction to anesthetics.  Lung and heart problems.  Inhaling food or liquids from your stomach into your lungs (aspiration).  Injury to nerves.  Waking up during your procedure and being unable to move (rare).  Extreme agitation or a state of mental confusion (delirium) when you wake up from the anesthetic.  Air in the bloodstream, which can lead to stroke.  These problems are more likely to develop if you are having a major surgery or if you have an advanced medical condition. You can prevent some of  these complications by answering all of your health care provider's questions thoroughly and by following all pre-procedure instructions. General anesthesia can cause side effects, including:  Nausea or vomiting  A sore throat from the breathing tube.  Feeling cold or shivery.  Feeling tired, washed out, or achy.  Sleepiness or  drowsiness.  Confusion or agitation.  What happens before the procedure? Staying hydrated Follow instructions from your health care provider about hydration, which may include:  Up to 2 hours before the procedure - you may continue to drink clear liquids, such as water, clear fruit juice, black coffee, and plain tea.  Eating and drinking restrictions Follow instructions from your health care provider about eating and drinking, which may include:  8 hours before the procedure - stop eating heavy meals or foods such as meat, fried foods, or fatty foods.  6 hours before the procedure - stop eating light meals or foods, such as toast or cereal.  6 hours before the procedure - stop drinking milk or drinks that contain milk.  2 hours before the procedure - stop drinking clear liquids.  Medicines  Ask your health care provider about: ? Changing or stopping your regular medicines. This is especially important if you are taking diabetes medicines or blood thinners. ? Taking medicines such as aspirin and ibuprofen. These medicines can thin your blood. Do not take these medicines before your procedure if your health care provider instructs you not to. ? Taking new dietary supplements or medicines. Do not take these during the week before your procedure unless your health care provider approves them.  If you are told to take a medicine or to continue taking a medicine on the day of the procedure, take the medicine with sips of water. General instructions   Ask if you will be going home the same day, the following day, or after a longer hospital stay. ? Plan to have someone take you home. ? Plan to have someone stay with you for the first 24 hours after you leave the hospital or clinic.  For 3-6 weeks before the procedure, try not to use any tobacco products, such as cigarettes, chewing tobacco, and e-cigarettes.  You may brush your teeth on the morning of the procedure, but make sure to  spit out the toothpaste. What happens during the procedure?  You will be given anesthetics through a mask and through an IV tube in one of your veins.  You may receive medicine to help you relax (sedative).  As soon as you are asleep, a breathing tube may be used to help you breathe.  An anesthesia specialist will stay with you throughout the procedure. He or she will help keep you comfortable and safe by continuing to give you medicines and adjusting the amount of medicine that you get. He or she will also watch your blood pressure, pulse, and oxygen levels to make sure that the anesthetics do not cause any problems.  If a breathing tube was used to help you breathe, it will be removed before you wake up. The procedure may vary among health care providers and hospitals. What happens after the procedure?  You will wake up, often slowly, after the procedure is complete, usually in a recovery area.  Your blood pressure, heart rate, breathing rate, and blood oxygen level will be monitored until the medicines you were given have worn off.  You may be given medicine to help you calm down if you feel anxious or agitated.  If you  will be going home the same day, your health care provider may check to make sure you can stand, drink, and urinate.  Your health care providers will treat your pain and side effects before you go home.  Do not drive for 24 hours if you received a sedative.  You may: ? Feel nauseous and vomit. ? Have a sore throat. ? Have mental slowness. ? Feel cold or shivery. ? Feel sleepy. ? Feel tired. ? Feel sore or achy, even in parts of your body where you did not have surgery. This information is not intended to replace advice given to you by your health care provider. Make sure you discuss any questions you have with your health care provider. Document Released: 07/25/2007 Document Revised: 09/28/2015 Document Reviewed: 04/01/2015 Elsevier Interactive Patient  Education  2018 [place] Anesthesia, Adult, Care After These instructions provide you with information about caring for yourself after your procedure. Your health care provider may also give you more specific instructions. Your treatment has been planned according to current medical practices, but problems sometimes occur. Call your health care provider if you have any problems or questions after your procedure. What can I expect after the procedure? After the procedure, it is common to have:  Vomiting.  A sore throat.  Mental slowness.  It is common to feel:  Nauseous.  Cold or shivery.  Sleepy.  Tired.  Sore or achy, even in parts of your body where you did not have surgery.  Follow these instructions at home: For at least 24 hours after the procedure:  Do not: ? Participate in activities where you could fall or become injured. ? Drive. ? Use heavy machinery. ? Drink alcohol. ? Take sleeping pills or medicines that cause drowsiness. ? Make important decisions or sign legal documents. ? Take care of children on your own.  Rest. Eating and drinking  If you vomit, drink water, juice, or soup when you can drink without vomiting.  Drink enough fluid to keep your urine clear or pale yellow.  Make sure you have little or no nausea before eating solid foods.  Follow the diet recommended by your health care provider. General instructions  Have a responsible adult stay with you until you are awake and alert.  Return to your normal activities as told by your health care provider. Ask your health care provider what activities are safe for you.  Take over-the-counter and prescription medicines only as told by your health care provider.  If you smoke, do not smoke without supervision.  Keep all follow-up visits as told by your health care provider. This is important. Contact a health care provider if:  You continue to have nausea or vomiting at home, and  medicines are not helpful.  You cannot drink fluids or start eating again.  You cannot urinate after 8-12 hours.  You develop a skin rash.  You have fever.  You have increasing redness at the site of your procedure. Get help right away if:  You have difficulty breathing.  You have chest pain.  You have unexpected bleeding.  You feel that you are having a life-threatening or urgent problem. This information is not intended to replace advice given to you by your health care provider. Make sure you discuss any questions you have with your health care provider. Document Released: 07/24/2000 Document Revised: 09/20/2015 Document Reviewed: 04/01/2015 Elsevier Interactive Patient Education  [employer].

## 2018-02-13 ENCOUNTER — Telehealth: Payer: Self-pay | Admitting: *Deleted

## 2018-02-13 ENCOUNTER — Other Ambulatory Visit: Payer: Self-pay | Admitting: Obstetrics & Gynecology

## 2018-02-13 ENCOUNTER — Encounter: Payer: Self-pay | Admitting: *Deleted

## 2018-02-15 ENCOUNTER — Encounter (HOSPITAL_COMMUNITY): Payer: Self-pay

## 2018-02-15 ENCOUNTER — Encounter (HOSPITAL_COMMUNITY)
Admission: RE | Admit: 2018-02-15 | Discharge: 2018-02-15 | Disposition: A | Discharge status: Home / Self Care | Payer: BLUE CROSS/BLUE SHIELD | Source: Ambulatory Visit | Attending: Obstetrics & Gynecology | Admitting: Obstetrics & Gynecology

## 2018-02-15 DIAGNOSIS — Z01812 Encounter for preprocedural laboratory examination: Secondary | ICD-10-CM | POA: Diagnosis not present

## 2018-02-15 HISTORY — DX: Unspecified osteoarthritis, unspecified site: M19.90

## 2018-02-15 HISTORY — DX: Major depressive disorder, single episode, unspecified: F32.9

## 2018-02-15 HISTORY — DX: Depression, unspecified: F32.A

## 2018-02-15 LAB — CBC
HEMATOCRIT: 38.1 % (ref 36.0–46.0)
Hemoglobin: 12 g/dL (ref 12.0–15.0)
MCH: 28.6 pg (ref 26.0–34.0)
MCHC: 31.5 g/dL (ref 30.0–36.0)
MCV: 90.9 fL (ref 80.0–100.0)
Platelets: 597 10*3/uL — ABNORMAL HIGH (ref 150–400)
RBC: 4.19 MIL/uL (ref 3.87–5.11)
RDW: 14.8 % (ref 11.5–15.5)
WBC: 9.8 10*3/uL (ref 4.0–10.5)
nRBC: 0 % (ref 0.0–0.2)

## 2018-02-15 LAB — URINALYSIS, ROUTINE W REFLEX MICROSCOPIC
BACTERIA UA: NONE SEEN
BILIRUBIN URINE: NEGATIVE
GLUCOSE, UA: NEGATIVE mg/dL
Ketones, ur: NEGATIVE mg/dL
LEUKOCYTES UA: NEGATIVE
NITRITE: NEGATIVE
PROTEIN: 30 mg/dL — AB
Specific Gravity, Urine: 1.031 — ABNORMAL HIGH (ref 1.005–1.030)
pH: 5 (ref 5.0–8.0)

## 2018-02-15 LAB — COMPREHENSIVE METABOLIC PANEL
ALBUMIN: 3.9 g/dL (ref 3.5–5.0)
ALT: 20 U/L (ref 0–44)
ANION GAP: 7 (ref 5–15)
AST: 22 U/L (ref 15–41)
Alkaline Phosphatase: 61 U/L (ref 38–126)
BILIRUBIN TOTAL: 0.4 mg/dL (ref 0.3–1.2)
BUN: 15 mg/dL (ref 6–20)
CALCIUM: 9.4 mg/dL (ref 8.9–10.3)
CO2: 21 mmol/L — ABNORMAL LOW (ref 22–32)
Chloride: 110 mmol/L (ref 98–111)
Creatinine, Ser: 0.84 mg/dL (ref 0.44–1.00)
GFR calc non Af Amer: >60 mL/min (ref >60)
Glucose, Bld: 106 mg/dL — ABNORMAL HIGH (ref 70–99)
POTASSIUM: 3.7 mmol/L (ref 3.5–5.1)
Sodium: 138 mmol/L (ref 135–145)
TOTAL PROTEIN: 7 g/dL (ref 6.5–8.1)

## 2018-02-15 LAB — TYPE AND SCREEN
ABO/RH(D): A POS
Antibody Screen: NEGATIVE

## 2018-02-15 LAB — RAPID HIV SCREEN (HIV 1/2 AB+AG)
HIV 1/2 ANTIBODIES: NONREACTIVE
HIV-1 P24 ANTIGEN - HIV24: NONREACTIVE

## 2018-02-15 LAB — HCG, QUANTITATIVE, PREGNANCY: HCG, BETA CHAIN, QUANT, S: 2 m[IU]/mL (ref <5)

## 2018-02-19 NOTE — H&P (Signed)
Preoperative History and Physical  Brandy Rivera is a 57 y.o. [id number] with No LMP recorded. admitted for a abdominal supracervical hysterectomy with removal of both tubes and ovaries.  She is having continued daily bleeding with clots and heavy cramps despite megestrol and now cyclical prometrium neither of which ahs been effective She is s/o ablation also She has 14-16 weeks fibroids which is evidently the underlying reason we cannot control her bleeding  PMH:    Past Medical History:  Diagnosis Date  . Anemia   . Arthritis   . Depression   . Mental disorder    depression    PSH:     Past Surgical History:  Procedure Laterality Date  . CESAREAN SECTION    . ENDOMETRIAL ABLATION    . HERNIA REPAIR Right    inguinal    POb/GynH:      OB History    Gravida  3   Para  3   Term  3   Preterm      AB      Living  2     SAB      TAB      Ectopic      Multiple      Live Births  2           SH:   Social History   Tobacco Use  . Smoking status: Current Every Day Smoker    Packs/day: 0.50    Years: 40.00    Pack years: 20.00    Types: Cigarettes  . Smokeless tobacco: Never Used  Substance Use Topics  . Alcohol use: Not Currently  . Drug use: Never    FH:    Family History  Problem Relation Age of Onset  . Cancer Father        stomach  . Hypertension Mother   . Diabetes Brother   . Hypertension Sister   . Miscarriages / [place] Daughter      Allergies: No Known Allergies  Medications:      No current facility-administered medications for this encounter.   Current Outpatient Medications:  .  BELVIQ 10 MG TABS, Take 1 tablet by mouth daily., Disp: , Rfl: 3 .  ferrous sulfate 325 (65 FE) MG tablet, Take 325 mg by mouth daily with breakfast., Disp: , Rfl:  .  ibuprofen (ADVIL,MOTRIN) 200 MG tablet, Take 600 mg by mouth every 8 (eight) hours as needed (pain). , Disp: , Rfl:  .  megestrol (MEGACE) 40 MG tablet, 3 tablets a day for 5 days, 2  tablets a day for 5 days then 1 tablet daily (Patient taking differently: Take 120 mg by mouth daily. ), Disp: 45 tablet, Rfl: 3 .  venlafaxine (EFFEXOR) 75 MG tablet, Take 75 mg by mouth 2 (two) times daily with a meal. , Disp: , Rfl: 2  Review of Systems:   Review of Systems  Constitutional: Negative for fever, chills, weight loss, malaise/fatigue and diaphoresis.  HENT: Negative for hearing loss, ear pain, nosebleeds, congestion, sore throat, neck pain, tinnitus and ear discharge.   Eyes: Negative for blurred vision, double vision, photophobia, pain, discharge and redness.  Respiratory: Negative for cough, hemoptysis, sputum production, shortness of breath, wheezing and stridor.   Cardiovascular: Negative for chest pain, palpitations, orthopnea, claudication, leg swelling and PND.  Gastrointestinal: Positive for abdominal pain. Negative for heartburn, nausea, vomiting, diarrhea, constipation, blood in stool and melena.  Genitourinary: Negative for dysuria, urgency, frequency, hematuria and flank pain.  Musculoskeletal:  Negative for myalgias, back pain, joint pain and falls.  Skin: Negative for itching and rash.  Neurological: Negative for dizziness, tingling, tremors, sensory change, speech change, focal weakness, seizures, loss of consciousness, weakness and headaches.  Endo/Heme/Allergies: Negative for environmental allergies and polydipsia. Does not bruise/bleed easily.  Psychiatric/Behavioral: Negative for depression, suicidal ideas, hallucinations, memory loss and substance abuse. The patient is not nervous/anxious and does not have insomnia.      PHYSICAL EXAM:  There were no vitals taken for this visit.    Vitals reviewed. Constitutional: She is oriented to person, place, and time. She appears well-developed and well-nourished.  HENT:  Head: Normocephalic and atraumatic.  Right Ear: External ear normal.  Left Ear: External ear normal.  Nose: Nose normal.  Mouth/Throat:  Oropharynx is clear and moist.  Eyes: Conjunctivae and EOM are normal. Pupils are equal, round, and reactive to light. Right eye exhibits no discharge. Left eye exhibits no discharge. No scleral icterus.  Neck: Normal range of motion. Neck supple. No tracheal deviation present. No thyromegaly present.  Cardiovascular: Normal rate, regular rhythm, normal heart sounds and intact distal pulses.  Exam reveals no gallop and no friction rub.   No murmur heard. Respiratory: Effort normal and breath sounds normal. No respiratory distress. She has no wheezes. She has no rales. She exhibits no tenderness.  GI: Soft. Bowel sounds are normal. She exhibits no distension and no mass. There is tenderness. There is no rebound and no guarding.  Genitourinary:       Vulva is normal without lesions Vagina is pink moist without discharge Cervix normal in appearance and pap is normal Uterus is 16 weeks sixe with fibroids Adnexa is negative with normal sized ovaries by sonogram  Musculoskeletal: Normal range of motion. She exhibits no edema and no tenderness.  Neurological: She is alert and oriented to person, place, and time. She has normal reflexes. She displays normal reflexes. No cranial nerve deficit. She exhibits normal muscle tone. Coordination normal.  Skin: Skin is warm and dry. No rash noted. No erythema. No pallor.  Psychiatric: She has a normal mood and affect. Her behavior is normal. Judgment and thought content normal.    Labs: Results for orders placed or performed during the hospital encounter of 02/15/18 (from the past 336 hour(s))  CBC   Collection Time: 02/15/18  2:17 PM  Result Value Ref Range   WBC 9.8 4.0 - 10.5 K/uL   RBC 4.19 3.87 - 5.11 MIL/uL   Hemoglobin 12.0 12.0 - 15.0 g/dL   HCT 38.1 36.0 - 46.0 %   MCV 90.9 80.0 - 100.0 fL   MCH 28.6 26.0 - 34.0 pg   MCHC 31.5 30.0 - 36.0 g/dL   RDW 14.8 11.5 - 15.5 %   Platelets 597 (H) 150 - 400 K/uL   nRBC 0.0 0.0 - 0.2 %  Comprehensive  metabolic panel   Collection Time: 02/15/18  2:17 PM  Result Value Ref Range   Sodium 138 135 - 145 mmol/L   Potassium 3.7 3.5 - 5.1 mmol/L   Chloride 110 98 - 111 mmol/L   CO2 21 (L) 22 - 32 mmol/L   Glucose, Bld 106 (H) 70 - 99 mg/dL   BUN 15 6 - 20 mg/dL   Creatinine, Ser 0.84 0.44 - 1.00 mg/dL   Calcium 9.4 8.9 - 10.3 mg/dL   Total Protein 7.0 6.5 - 8.1 g/dL   Albumin 3.9 3.5 - 5.0 g/dL   AST 22 15 - 41 U/L  ALT 20 0 - 44 U/L   Alkaline Phosphatase 61 38 - 126 U/L   Total Bilirubin 0.4 0.3 - 1.2 mg/dL   GFR calc non Af Amer >60 >60 mL/min   GFR calc Af Amer >60 >60 mL/min   Anion gap 7 5 - 15  hCG, quantitative, pregnancy   Collection Time: 02/15/18  2:17 PM  Result Value Ref Range   hCG, Beta Chain, Quant, S 2 <5 mIU/mL  Rapid HIV screen (HIV 1/2 Ab+Ag)   Collection Time: 02/15/18  2:17 PM  Result Value Ref Range   HIV-1 P24 Antigen - HIV24 NON REACTIVE NON REACTIVE   HIV 1/2 Antibodies NON REACTIVE NON REACTIVE   Interpretation (HIV Ag Ab)      A non reactive test result means that HIV 1 or HIV 2 antibodies and HIV 1 p24 antigen were not detected in the specimen.  Urinalysis, Routine w reflex microscopic   Collection Time: 02/15/18  2:17 PM  Result Value Ref Range   Color, Urine YELLOW YELLOW   APPearance HAZY (A) CLEAR   Specific Gravity, Urine 1.031 (H) 1.005 - 1.030   pH 5.0 5.0 - 8.0   Glucose, UA NEGATIVE NEGATIVE mg/dL   Hgb urine dipstick LARGE (A) NEGATIVE   Bilirubin Urine NEGATIVE NEGATIVE   Ketones, ur NEGATIVE NEGATIVE mg/dL   Protein, ur 30 (A) NEGATIVE mg/dL   Nitrite NEGATIVE NEGATIVE   Leukocytes, UA NEGATIVE NEGATIVE   RBC / HPF >50 (H) 0 - 5 RBC/hpf   WBC, UA 0-5 0 - 5 WBC/hpf   Bacteria, UA NONE SEEN NONE SEEN   Squamous Epithelial / LPF 0-5 0 - 5   Mucus PRESENT    Ca Oxalate Crys, UA PRESENT   Type and screen   Collection Time: 02/15/18  2:17 PM  Result Value Ref Range   ABO/RH(D) A POS    Antibody Screen NEG    Sample Expiration  03/01/2018    Extend sample reason      NO TRANSFUSIONS OR PREGNANCY IN THE PAST 3 MONTHS Performed at [facility], [address] [place], [place] [address]     EKG: No orders found for this or any previous visit.  Imaging Studies: No results found.    Assessment: Fibroid uterus 16 weeks size Menometrorrhagia unresponsive to aggressive progestational therapy S/P ablation 2004  Plan: Abdominal supracervical hysterectomy with removal of both tubes and ovaries  Pt understands the risks of surgery including but not limited t  excessive bleeding requiring transfusion or reoperation, post-operative infection requiring prolonged hospitalization or re-hospitalization and antibiotic therapy, and damage to other organs including bladder, bowel, ureters and major vessels.  The patient also understands the alternative treatment options which were discussed in full.  All questions were answered.  Mertie Clause Melea Prezioso 02/19/2018 12:06 PM   Mertie Clause Campbell Kray 02/19/2018 12:05 PM

## 2018-02-20 ENCOUNTER — Inpatient Hospital Stay (HOSPITAL_COMMUNITY)
Admission: RE | Admit: 2018-02-20 | Discharge: 2018-02-21 | DRG: 743 | Disposition: A | Discharge status: Home / Self Care | Payer: BLUE CROSS/BLUE SHIELD | Attending: Obstetrics & Gynecology | Admitting: Obstetrics & Gynecology

## 2018-02-20 ENCOUNTER — Inpatient Hospital Stay (HOSPITAL_COMMUNITY): Payer: BLUE CROSS/BLUE SHIELD | Admitting: Anesthesiology

## 2018-02-20 ENCOUNTER — Encounter (HOSPITAL_COMMUNITY)
Admission: RE | Disposition: A | Discharge status: Home / Self Care | Payer: Self-pay | Source: Home / Self Care | Attending: Obstetrics & Gynecology

## 2018-02-20 ENCOUNTER — Other Ambulatory Visit: Payer: Self-pay

## 2018-02-20 ENCOUNTER — Encounter (HOSPITAL_COMMUNITY): Payer: Self-pay | Admitting: Anesthesiology

## 2018-02-20 DIAGNOSIS — N921 Excessive and frequent menstruation with irregular cycle: Secondary | ICD-10-CM | POA: Diagnosis not present

## 2018-02-20 DIAGNOSIS — F17210 Nicotine dependence, cigarettes, uncomplicated: Secondary | ICD-10-CM | POA: Diagnosis not present

## 2018-02-20 DIAGNOSIS — Z90711 Acquired absence of uterus with remaining cervical stump: Secondary | ICD-10-CM | POA: Diagnosis present

## 2018-02-20 DIAGNOSIS — Z79899 Other long term (current) drug therapy: Secondary | ICD-10-CM

## 2018-02-20 DIAGNOSIS — M1990 Unspecified osteoarthritis, unspecified site: Secondary | ICD-10-CM | POA: Diagnosis not present

## 2018-02-20 DIAGNOSIS — D259 Leiomyoma of uterus, unspecified: Secondary | ICD-10-CM | POA: Diagnosis not present

## 2018-02-20 DIAGNOSIS — F329 Major depressive disorder, single episode, unspecified: Secondary | ICD-10-CM | POA: Diagnosis present

## 2018-02-20 DIAGNOSIS — F17200 Nicotine dependence, unspecified, uncomplicated: Secondary | ICD-10-CM | POA: Diagnosis not present

## 2018-02-20 DIAGNOSIS — N83202 Unspecified ovarian cyst, left side: Secondary | ICD-10-CM | POA: Diagnosis not present

## 2018-02-20 DIAGNOSIS — Z90710 Acquired absence of both cervix and uterus: Secondary | ICD-10-CM | POA: Diagnosis present

## 2018-02-20 DIAGNOSIS — N920 Excessive and frequent menstruation with regular cycle: Secondary | ICD-10-CM | POA: Diagnosis not present

## 2018-02-20 HISTORY — PX: SALPINGOOPHORECTOMY: SHX82

## 2018-02-20 HISTORY — PX: SUPRACERVICAL ABDOMINAL HYSTERECTOMY: SHX5393

## 2018-02-20 SURGERY — HYSTERECTOMY, SUPRACERVICAL, ABDOMINAL
Anesthesia: General

## 2018-02-20 MED ORDER — BUPIVACAINE LIPOSOME 1.3 % IJ SUSP
INTRAMUSCULAR | Status: AC
  Filled 2018-02-20: qty 20

## 2018-02-20 MED ORDER — FENTANYL CITRATE (PF) 100 MCG/2ML IJ SOLN
INTRAMUSCULAR | Status: DC | PRN
  Administered 2018-02-20 (×10): 50 ug via INTRAVENOUS

## 2018-02-20 MED ORDER — SODIUM CHLORIDE 0.9 % IV SOLN: 8.0000 mg | Freq: Four times a day (QID) | INTRAVENOUS | Status: DC | PRN

## 2018-02-20 MED ORDER — BISACODYL 10 MG RE SUPP: 10.0000 mg | Freq: Every day | RECTAL | Status: DC | PRN

## 2018-02-20 MED ORDER — GENTIAN VIOLET 1 % EX SOLN
CUTANEOUS | Status: DC | PRN
  Administered 2018-02-20: 1 via TOPICAL

## 2018-02-20 MED ORDER — MENTHOL 3 MG MT LOZG: 1.0000 | LOZENGE | OROMUCOSAL | Status: DC | PRN

## 2018-02-20 MED ORDER — SODIUM CHLORIDE 0.9% FLUSH
INTRAVENOUS | Status: AC
  Filled 2018-02-20: qty 10

## 2018-02-20 MED ORDER — MIDAZOLAM HCL 2 MG/2ML IJ SOLN
INTRAMUSCULAR | Status: AC
  Filled 2018-02-20: qty 2

## 2018-02-20 MED ORDER — KETOROLAC TROMETHAMINE 30 MG/ML IJ SOLN
30.0000 mg | Freq: Four times a day (QID) | INTRAMUSCULAR | Status: AC
  Administered 2018-02-20 – 2018-02-21 (×3): 30 mg via INTRAVENOUS
  Filled 2018-02-20 (×4): qty 1

## 2018-02-20 MED ORDER — HYDROMORPHONE HCL 1 MG/ML IJ SOLN
0.2500 mg | INTRAMUSCULAR | Status: DC | PRN
  Administered 2018-02-20 (×6): 0.5 mg via INTRAVENOUS
  Filled 2018-02-20 (×4): qty 0.5

## 2018-02-20 MED ORDER — FENTANYL CITRATE (PF) 250 MCG/5ML IJ SOLN
INTRAMUSCULAR | Status: AC
  Filled 2018-02-20: qty 5

## 2018-02-20 MED ORDER — SODIUM CHLORIDE 0.9 % IJ SOLN
INTRAMUSCULAR | Status: AC
  Filled 2018-02-20: qty 40

## 2018-02-20 MED ORDER — LEVOFLOXACIN IN D5W 750 MG/150ML IV SOLN
750.0000 mg | Freq: Once | INTRAVENOUS | Status: AC
  Administered 2018-02-20: 750 mg via INTRAVENOUS
  Filled 2018-02-20: qty 150

## 2018-02-20 MED ORDER — OXYCODONE-ACETAMINOPHEN 5-325 MG PO TABS
2.0000 | ORAL_TABLET | ORAL | Status: DC | PRN
  Administered 2018-02-21 (×2): 2 via ORAL
  Filled 2018-02-20 (×2): qty 2

## 2018-02-20 MED ORDER — ROCURONIUM BROMIDE 50 MG/5ML IV SOLN
INTRAVENOUS | Status: AC
  Filled 2018-02-20: qty 1

## 2018-02-20 MED ORDER — 0.9 % SODIUM CHLORIDE (POUR BTL) OPTIME
TOPICAL | Status: DC | PRN
  Administered 2018-02-20 (×4): 1000 mL

## 2018-02-20 MED ORDER — MIDAZOLAM HCL 2 MG/2ML IJ SOLN: 0.5000 mg | Freq: Once | INTRAMUSCULAR | Status: DC | PRN

## 2018-02-20 MED ORDER — DIPHENHYDRAMINE HCL 50 MG/ML IJ SOLN: 25.0000 mg | Freq: Four times a day (QID) | INTRAMUSCULAR | Status: DC | PRN

## 2018-02-20 MED ORDER — VENLAFAXINE HCL 37.5 MG PO TABS
75.0000 mg | ORAL_TABLET | Freq: Two times a day (BID) | ORAL | Status: DC
  Administered 2018-02-20 – 2018-02-21 (×2): 75 mg via ORAL
  Filled 2018-02-20 (×2): qty 2

## 2018-02-20 MED ORDER — HYDROCODONE-ACETAMINOPHEN 7.5-325 MG PO TABS: 1.0000 | ORAL_TABLET | Freq: Once | ORAL | Status: DC | PRN

## 2018-02-20 MED ORDER — PROMETHAZINE HCL 25 MG/ML IJ SOLN
6.2500 mg | INTRAMUSCULAR | Status: DC | PRN
  Administered 2018-02-20: 6.25 mg via INTRAVENOUS
  Filled 2018-02-20: qty 1

## 2018-02-20 MED ORDER — GLYCOPYRROLATE 0.2 MG/ML IJ SOLN
INTRAMUSCULAR | Status: AC
  Filled 2018-02-20: qty 4

## 2018-02-20 MED ORDER — ONDANSETRON HCL 4 MG PO TABS
8.0000 mg | ORAL_TABLET | Freq: Four times a day (QID) | ORAL | Status: DC | PRN
  Administered 2018-02-21: 8 mg via ORAL
  Filled 2018-02-20: qty 2

## 2018-02-20 MED ORDER — BUPIVACAINE LIPOSOME 1.3 % IJ SUSP
20.0000 mL | Freq: Once | INTRAMUSCULAR | Status: DC
  Filled 2018-02-20: qty 20

## 2018-02-20 MED ORDER — KETOROLAC TROMETHAMINE 30 MG/ML IJ SOLN
30.0000 mg | Freq: Once | INTRAMUSCULAR | Status: AC
  Administered 2018-02-20: 30 mg via INTRAVENOUS
  Filled 2018-02-20: qty 1

## 2018-02-20 MED ORDER — DOCUSATE SODIUM 100 MG PO CAPS
100.0000 mg | ORAL_CAPSULE | Freq: Two times a day (BID) | ORAL | Status: DC
  Administered 2018-02-20 – 2018-02-21 (×2): 100 mg via ORAL
  Filled 2018-02-20 (×2): qty 1

## 2018-02-20 MED ORDER — SUCCINYLCHOLINE CHLORIDE 20 MG/ML IJ SOLN
INTRAMUSCULAR | Status: AC
  Filled 2018-02-20: qty 1

## 2018-02-20 MED ORDER — ONDANSETRON HCL 4 MG/2ML IJ SOLN
INTRAMUSCULAR | Status: AC
  Filled 2018-02-20: qty 2

## 2018-02-20 MED ORDER — LACTATED RINGERS IV SOLN
INTRAVENOUS | Status: DC
  Administered 2018-02-20 (×4): via INTRAVENOUS

## 2018-02-20 MED ORDER — KCL IN DEXTROSE-NACL 20-5-0.45 MEQ/L-%-% IV SOLN
INTRAVENOUS | Status: DC
  Administered 2018-02-20 – 2018-02-21 (×2): via INTRAVENOUS

## 2018-02-20 MED ORDER — ROCURONIUM 10MG/ML (10ML) SYRINGE FOR MEDFUSION PUMP - OPTIME
INTRAVENOUS | Status: DC | PRN
  Administered 2018-02-20: 50 mg via INTRAVENOUS
  Administered 2018-02-20: 20 mg via INTRAVENOUS

## 2018-02-20 MED ORDER — PROPOFOL 10 MG/ML IV BOLUS
INTRAVENOUS | Status: AC
  Filled 2018-02-20: qty 40

## 2018-02-20 MED ORDER — SENNOSIDES-DOCUSATE SODIUM 8.6-50 MG PO TABS: 1.0000 | ORAL_TABLET | Freq: Every evening | ORAL | Status: DC | PRN

## 2018-02-20 MED ORDER — ONDANSETRON HCL 4 MG/2ML IJ SOLN
INTRAMUSCULAR | Status: DC | PRN
  Administered 2018-02-20: 4 mg via INTRAVENOUS

## 2018-02-20 MED ORDER — LIDOCAINE HCL (CARDIAC) PF 50 MG/5ML IV SOSY
PREFILLED_SYRINGE | INTRAVENOUS | Status: DC | PRN
  Administered 2018-02-20: 40 mg via INTRAVENOUS

## 2018-02-20 MED ORDER — NEOSTIGMINE METHYLSULFATE 10 MG/10ML IV SOLN
INTRAVENOUS | Status: AC
  Filled 2018-02-20: qty 2

## 2018-02-20 MED ORDER — PROMETHAZINE HCL 25 MG/ML IJ SOLN: 25.0000 mg | Freq: Four times a day (QID) | INTRAMUSCULAR | Status: DC | PRN

## 2018-02-20 MED ORDER — HEMOSTATIC AGENTS (NO CHARGE) OPTIME
TOPICAL | Status: DC | PRN
  Administered 2018-02-20: 1 via TOPICAL

## 2018-02-20 MED ORDER — MIDAZOLAM HCL 5 MG/5ML IJ SOLN
INTRAMUSCULAR | Status: DC | PRN
  Administered 2018-02-20: 2 mg via INTRAVENOUS

## 2018-02-20 MED ORDER — PROPOFOL 10 MG/ML IV BOLUS
INTRAVENOUS | Status: DC | PRN
  Administered 2018-02-20: 200 mg via INTRAVENOUS

## 2018-02-20 MED ORDER — HYDROMORPHONE HCL 1 MG/ML IJ SOLN
INTRAMUSCULAR | Status: AC
  Filled 2018-02-20: qty 1

## 2018-02-20 MED ORDER — LIDOCAINE HCL (PF) 1 % IJ SOLN
INTRAMUSCULAR | Status: AC
  Filled 2018-02-20: qty 5

## 2018-02-20 MED ORDER — HYDROMORPHONE HCL 1 MG/ML IJ SOLN: 0.5000 mg | INTRAMUSCULAR | Status: DC | PRN

## 2018-02-20 MED ORDER — HYDROMORPHONE HCL 1 MG/ML IJ SOLN
0.5000 mg | INTRAMUSCULAR | Status: DC | PRN
  Administered 2018-02-20: 1 mg via INTRAVENOUS
  Filled 2018-02-20: qty 1

## 2018-02-20 MED ORDER — CEFAZOLIN SODIUM-DEXTROSE 2-4 GM/100ML-% IV SOLN
2.0000 g | INTRAVENOUS | Status: AC
  Administered 2018-02-20: 2 g via INTRAVENOUS
  Filled 2018-02-20: qty 100

## 2018-02-20 MED ORDER — ZOLPIDEM TARTRATE 5 MG PO TABS: 5.0000 mg | ORAL_TABLET | Freq: Every evening | ORAL | Status: DC | PRN

## 2018-02-20 MED ORDER — ALUM & MAG HYDROXIDE-SIMETH 200-200-20 MG/5ML PO SUSP: 30.0000 mL | ORAL | Status: DC | PRN

## 2018-02-20 MED ORDER — LORCASERIN HCL 10 MG PO TABS: 1.0000 | ORAL_TABLET | Freq: Every day | ORAL | Status: DC

## 2018-02-20 MED ORDER — SODIUM CHLORIDE 0.9 % IV SOLN
INTRAVENOUS | Status: DC | PRN
  Administered 2018-02-20: 60 mL

## 2018-02-20 SURGICAL SUPPLY — 59 items
BLADE SURG SZ10 CARB STEEL (BLADE) ×4 IMPLANT
CATH FOLEY 2WAY SLVR  5CC 14FR (CATHETERS) ×2
CATH FOLEY 2WAY SLVR 5CC 14FR (CATHETERS) ×2 IMPLANT
CLOTH BEACON ORANGE TIMEOUT ST (SAFETY) ×4 IMPLANT
COVER LIGHT HANDLE STERIS (MISCELLANEOUS) ×8 IMPLANT
DERMABOND ADVANCED (GAUZE/BANDAGES/DRESSINGS) ×2
DERMABOND ADVANCED .7 DNX12 (GAUZE/BANDAGES/DRESSINGS) ×2 IMPLANT
DRAPE UTILITY W/TAPE 26X15 (DRAPES) ×4 IMPLANT
DRAPE WARM FLUID 44X44 (DRAPE) ×4 IMPLANT
DRSG OPSITE POSTOP 4X10 (GAUZE/BANDAGES/DRESSINGS) ×4 IMPLANT
ELECT REM PT RETURN 9FT ADLT (ELECTROSURGICAL) ×4
ELECTRODE REM PT RTRN 9FT ADLT (ELECTROSURGICAL) ×2 IMPLANT
GAUZE 4X4 16PLY RFD (DISPOSABLE) ×4 IMPLANT
GAUZE SPONGE 4X4 12PLY STRL (GAUZE/BANDAGES/DRESSINGS) ×4 IMPLANT
GLOVE BIOGEL PI IND STRL 6.5 (GLOVE) ×2 IMPLANT
GLOVE BIOGEL PI IND STRL 7.0 (GLOVE) ×8 IMPLANT
GLOVE BIOGEL PI IND STRL 8 (GLOVE) ×2 IMPLANT
GLOVE BIOGEL PI INDICATOR 6.5 (GLOVE) ×2
GLOVE BIOGEL PI INDICATOR 7.0 (GLOVE) ×8
GLOVE BIOGEL PI INDICATOR 8 (GLOVE) ×2
GLOVE ECLIPSE 7.0 STRL STRAW (GLOVE) ×4 IMPLANT
GLOVE ECLIPSE 8.0 STRL XLNG CF (GLOVE) ×4 IMPLANT
GLOVE SURG SS PI 6.5 STRL IVOR (GLOVE) ×4 IMPLANT
GOWN STRL REUS W/ TWL LRG LVL3 (GOWN DISPOSABLE) ×4 IMPLANT
GOWN STRL REUS W/TWL LRG LVL3 (GOWN DISPOSABLE) ×12 IMPLANT
GOWN STRL REUS W/TWL XL LVL3 (GOWN DISPOSABLE) ×4 IMPLANT
HEMOSTAT ARISTA ABSORB 3G PWDR (MISCELLANEOUS) ×4 IMPLANT
INST SET MAJOR GENERAL (KITS) ×4 IMPLANT
KIT TURNOVER KIT A (KITS) ×4 IMPLANT
MANIFOLD NEPTUNE II (INSTRUMENTS) ×4 IMPLANT
NEEDLE HYPO 18GX1.5 BLUNT FILL (NEEDLE) ×4 IMPLANT
NEEDLE HYPO 21X1.5 SAFETY (NEEDLE) ×4 IMPLANT
NS IRRIG 1000ML POUR BTL (IV SOLUTION) ×16 IMPLANT
PACK ABDOMINAL MAJOR (CUSTOM PROCEDURE TRAY) ×4 IMPLANT
PAD ABD 5X9 TENDERSORB (GAUZE/BANDAGES/DRESSINGS) ×8 IMPLANT
PAD ARMBOARD 7.5X6 YLW CONV (MISCELLANEOUS) ×4 IMPLANT
SET BASIN LINEN APH (SET/KITS/TRAYS/PACK) ×4 IMPLANT
SPONGE LAP 18X18 RF (DISPOSABLE) ×16 IMPLANT
STAPLER VISISTAT 35W (STAPLE) ×4 IMPLANT
SUT CHROMIC 0 CT 1 (SUTURE) ×4 IMPLANT
SUT MNCRL+ AB 3-0 CT1 36 (SUTURE) IMPLANT
SUT MON AB 3-0 SH 27 (SUTURE) ×8 IMPLANT
SUT MONOCRYL AB 3-0 CT1 36IN (SUTURE)
SUT PDS AB 0 CTX 60 (SUTURE) ×4 IMPLANT
SUT PLAIN 2 0 (SUTURE) ×2
SUT PLAIN 2 0 XLH (SUTURE) ×4 IMPLANT
SUT PLAIN ABS 2-0 CT1 27XMFL (SUTURE) ×2 IMPLANT
SUT VIC AB 0 CT1 27 (SUTURE) ×6
SUT VIC AB 0 CT1 27XBRD ANTBC (SUTURE) ×2 IMPLANT
SUT VIC AB 0 CT1 27XCR 8 STRN (SUTURE) ×4 IMPLANT
SUT VIC AB 0 CTX 36 (SUTURE) ×2
SUT VIC AB 0 CTX36XBRD ANTBCTR (SUTURE) ×2 IMPLANT
SUT VICRYL 3 0 (SUTURE) ×4 IMPLANT
SYR 20CC LL (SYRINGE) ×4 IMPLANT
SYRINGE 20CC LL (MISCELLANEOUS) ×3 IMPLANT
TAPE CLOTH SURG 4X10 WHT LF (GAUZE/BANDAGES/DRESSINGS) ×4 IMPLANT
TRAY FOLEY MTR SLVR 16FR STAT (SET/KITS/TRAYS/PACK) ×4 IMPLANT
TRAY FOLEY W/BAG SLVR 16FR (SET/KITS/TRAYS/PACK) ×4
TRAY FOLEY W/BAG SLVR 16FR ST (SET/KITS/TRAYS/PACK) ×4 IMPLANT

## 2018-02-20 NOTE — Anesthesia Postprocedure Evaluation (Signed)
Anesthesia Post Note  Patient: Brandy Rivera  Procedure(s) Performed: HYSTERECTOMY SUPRACERVICAL ABDOMINAL (N/A ) BILATERAL SALPINGO OOPHORECTOMY (Bilateral )  Patient location during evaluation: PACU Anesthesia Type: General Level of consciousness: awake and alert and oriented Pain management: pain level controlled Vital Signs Assessment: post-procedure vital signs reviewed and stable Respiratory status: spontaneous breathing Cardiovascular status: stable and blood pressure returned to baseline Postop Assessment: no apparent nausea or vomiting Anesthetic complications: no     Last Vitals:  Vitals:   02/20/18 1230 02/20/18 1245  BP: 138/66 131/66  Pulse: 67 72  Resp: 14 14  Temp:    SpO2: 92% 94%    Last Pain:  Vitals:   02/20/18 1245  TempSrc:   PainSc: 8                  Ronak Duquette

## 2018-02-20 NOTE — Anesthesia Procedure Notes (Signed)
Procedure Name: Intubation Date/Time: 02/20/2018 8:45 AM Performed by: Ollen Bowl, CRNA Pre-anesthesia Checklist: Patient identified, Patient being monitored, Timeout performed, Emergency Drugs available and Suction available Patient Re-evaluated:Patient Re-evaluated prior to induction Oxygen Delivery Method: Circle system utilized Preoxygenation: Pre-oxygenation with 100% oxygen Induction Type: IV induction Ventilation: Mask ventilation without difficulty Laryngoscope Size: Mac and 3 Grade View: Grade I Tube type: Oral Tube size: 7.0 mm Number of attempts: 1 Airway Equipment and Method: Stylet Placement Confirmation: ETT inserted through vocal cords under direct vision,  positive ETCO2 and breath sounds checked- equal and bilateral Secured at: 21 cm Tube secured with: Tape Dental Injury: Teeth and Oropharynx as per pre-operative assessment

## 2018-02-20 NOTE — Interval H&P Note (Signed)
History and Physical Interval Note:  02/20/2018 7:50 AM  Brandy Rivera  has presented today for surgery, with the diagnosis of menometrorrhagia pmb  The various methods of treatment have been discussed with the patient and family. After consideration of risks, benefits and other options for treatment, the patient has consented to  Procedure(s): HYSTERECTOMY SUPRACERVICAL ABDOMINAL (N/A) SALPINGO OOPHORECTOMY (Bilateral) as a surgical intervention .  The patient's history has been reviewed, patient examined, no change in status, stable for surgery.  I have reviewed the patient's chart and labs.  Questions were answered to the patient's satisfaction.     Florian Buff

## 2018-02-20 NOTE — Op Note (Signed)
Preoperative diagnosis:  1.  16 weeks size fibroids by volume                                         2.  Menometrorrhagia, unmanageable by megestrol therapy                                         3.  S/P endometrial ablation 2004                                         4.    Postoperative diagnosis:  Same as above   Procedure:  Abdominal hysterectomy, supracervical, with removal of both tubes and ovaries  Surgeon:  Florian Buff  Assistant:    Anesthesia:  General endotracheal  Preoperative clinical summary:  Brandy Rivera is a 57 y.o. [id number] with No LMP recorded. admitted for a abdominal supracervical hysterectomy with removal of both tubes and ovaries.  She is having continued daily bleeding with clots and heavy cramps despite megestrol and now cyclical prometrium neither of which ahs been effective She is s/o ablation also She has 14-16 weeks fibroids which is evidently the underlying reason we cannot control her bleeding  Intraoperative findings: larger than expected fibroids, adhesions of bladder around the left uterine vessels  Description of operation:  Patient was taken to the operating room and placed in the supine position where she underwent general endotracheal anesthesia.  She was then prepped and draped in the usual sterile fashion and a Foley catheter was placed for continuous bladder drainage.  A Pfannenstiel skin incision was made and carried down sharply to the rectus fascia which was scored in the midline and extended laterally.  The fascia was taken off the muscles superiorly and inferiorly without difficulty.  The muscles were divided.  The peritoneal cavity was entered.    The upper abdomen was packed away. I spent about 15 minutes trying tho deliver the uterus through the abdominal incision Finally after performing a couple of morcellations it was delivered.  Both uterine cornu were grasped with Coker clamps.  The left round ligament was suture ligated and coagulated  with the electrocautery unit.  The left vesicouterine serosal flap was created.  An avascular window in in the peritoneum was created and the utero-ovarian ligament was cross clamped, cut and suture ligated.  The right round ligament was suture ligated and cut with the electrocautery unit.  The vesicouterine serosal flap on the right was created.  An avascular window in the peritoneum was created and the right utero-ovarian ligament was cross clamped, cut and double suture ligated.  Thus both ovaries were preserved at this point There were dense adhesions of the bladder in the area of the left uterine blood supply. Care was taken to not injure the bladder and not disrupt the blood supply before clampping .  The uterine vessels were skeletonized bilaterally.  The uterine vessels were clamped bilaterally,  then cut and suture ligated.  Two more pedicles were taken down the cervix medial to the uterine vessels.  Each pedicle was clamped cut and suture ligated with good resulting hemostasis.  As per the preoperative plan the cervix was then transected sharply and the  specimen was removed.  The cervical stump was then closed anterior to posterior for hemostasis and reduce postoperative adhesions.  The pelvis was irrigated vigorously and all pedicles were examined and found to be hemostatic.   The tube and ovarian pedicle was cross clamped bilaterally and both tubes and opvaries were removed. Fore and aft sutures were placed with good hemostasis    All specimens were sent to pathology for routine evaluation.   Pictures were uploaded to the chart via [place]  the pelvis was irrigated vigorously.  All packs were removed and all counts were correct at this point x 3.  The muscles and peritoneum were reapproximated loosely.  The fascia was closed with 0 Vicryl running.  The subcutaneous tissue was reapproximated using 2-0 plain gut.  The skin was closed using staples  The patient was awakened from anesthesia taken to  the recovery room in good stable condition. All sponge instrument and needle counts were correct x 3.  The patient received 2 grams of Ancef and Toradol prophylactically preoperatively.  Estimated blood loss for the procedure was 750  cc.  EURE,LUTHER H 02/20/2018 11:08 AM

## 2018-02-20 NOTE — Anesthesia Preprocedure Evaluation (Signed)
Anesthesia Evaluation  Patient identified by MRN, date of birth, ID band Patient awake    Reviewed: Allergy & Precautions, NPO status , Patient's Chart, lab work & pertinent test results  Airway Mallampati: II  TM Distance: >3 FB Neck ROM: Full    Dental no notable dental hx. (+) Teeth Intact   Pulmonary neg pulmonary ROS, Current Smoker,    Pulmonary exam normal breath sounds clear to auscultation       Cardiovascular Exercise Tolerance: Good negative cardio ROS Normal cardiovascular examI Rhythm:Regular Rate:Normal     Neuro/Psych Depression negative neurological ROS  negative psych ROS   GI/Hepatic negative GI ROS, Neg liver ROS,   Endo/Other  Morbid obesity  Renal/GU negative Renal ROS  negative genitourinary   Musculoskeletal  (+) Arthritis ,   Abdominal   Peds negative pediatric ROS (+)  Hematology negative hematology ROS (+) anemia ,   Anesthesia Other Findings   Reproductive/Obstetrics negative OB ROS                             Anesthesia Physical Anesthesia Plan  ASA: III  Anesthesia Plan: General   Post-op Pain Management:    Induction: Intravenous  PONV Risk Score and Plan:   Airway Management Planned: Oral ETT  Additional Equipment:   Intra-op Plan:   Post-operative Plan: Extubation in OR  Informed Consent: I have reviewed the patients History and Physical, chart, labs and discussed the procedure including the risks, benefits and alternatives for the proposed anesthesia with the patient or authorized representative who has indicated his/her understanding and acceptance.   Dental advisory given  Plan Discussed with: CRNA  Anesthesia Plan Comments:         Anesthesia Quick Evaluation

## 2018-02-20 NOTE — Transfer of Care (Signed)
Immediate Anesthesia Transfer of Care Note  Patient: Brandy Rivera  Procedure(s) Performed: HYSTERECTOMY SUPRACERVICAL ABDOMINAL (N/A ) BILATERAL SALPINGO OOPHORECTOMY (Bilateral )  Patient Location: PACU  Anesthesia Type:General  Level of Consciousness: awake and alert   Airway & Oxygen Therapy: Patient Spontanous Breathing  Post-op Assessment: Report given to RN  Post vital signs: Reviewed and stable  Last Vitals:  Vitals Value Taken Time  BP 166/72 02/20/2018 11:15 AM  Temp    Pulse 95 02/20/2018 11:16 AM  Resp 17 02/20/2018 11:16 AM  SpO2 95 % 02/20/2018 11:16 AM  Vitals shown include unvalidated device data.  Last Pain:  Vitals:   02/20/18 6948  TempSrc: Oral  PainSc: 0-No pain      Patients Stated Pain Goal: 8 (54/62/70 3500)  Complications: No apparent anesthesia complications

## 2018-02-21 ENCOUNTER — Encounter (HOSPITAL_COMMUNITY): Payer: Self-pay | Admitting: Obstetrics & Gynecology

## 2018-02-21 ENCOUNTER — Telehealth: Payer: Self-pay | Admitting: *Deleted

## 2018-02-21 LAB — BASIC METABOLIC PANEL
ANION GAP: 4 — AB (ref 5–15)
BUN: 8 mg/dL (ref 6–20)
CALCIUM: 8.1 mg/dL — AB (ref 8.9–10.3)
CO2: 24 mmol/L (ref 22–32)
CREATININE: 0.8 mg/dL (ref 0.44–1.00)
Chloride: 109 mmol/L (ref 98–111)
Glucose, Bld: 109 mg/dL — ABNORMAL HIGH (ref 70–99)
Potassium: 4.1 mmol/L (ref 3.5–5.1)
SODIUM: 137 mmol/L (ref 135–145)

## 2018-02-21 LAB — CBC
HEMATOCRIT: 27.6 % — AB (ref 36.0–46.0)
Hemoglobin: 8.2 g/dL — ABNORMAL LOW (ref 12.0–15.0)
MCH: 28.5 pg (ref 26.0–34.0)
MCHC: 29.7 g/dL — AB (ref 30.0–36.0)
MCV: 95.8 fL (ref 80.0–100.0)
NRBC: 0 % (ref 0.0–0.2)
PLATELETS: 405 10*3/uL — AB (ref 150–400)
RBC: 2.88 MIL/uL — ABNORMAL LOW (ref 3.87–5.11)
RDW: 15.9 % — AB (ref 11.5–15.5)
WBC: 9.5 10*3/uL (ref 4.0–10.5)

## 2018-02-21 MED ORDER — OXYCODONE-ACETAMINOPHEN 7.5-325 MG PO TABS: 1.0000 | ORAL_TABLET | Freq: Four times a day (QID) | ORAL | 0 refills | Status: DC | PRN

## 2018-02-21 MED ORDER — SODIUM CHLORIDE 0.9 % IV SOLN
510.0000 mg | Freq: Once | INTRAVENOUS | Status: AC
  Administered 2018-02-21: 510 mg via INTRAVENOUS
  Filled 2018-02-21: qty 17

## 2018-02-21 MED ORDER — ONDANSETRON HCL 8 MG PO TABS: 8.0000 mg | ORAL_TABLET | Freq: Four times a day (QID) | ORAL | 0 refills | Status: DC | PRN

## 2018-02-21 MED ORDER — KETOROLAC TROMETHAMINE 10 MG PO TABS: 10.0000 mg | ORAL_TABLET | Freq: Three times a day (TID) | ORAL | 0 refills | Status: DC | PRN

## 2018-02-21 NOTE — Telephone Encounter (Signed)
Dr Elonda Husky spoke to pharmacy.  New regulations only allow 1 every 6 hours. Ok per Dr Elonda Husky

## 2018-02-21 NOTE — Discharge Summary (Signed)
Physician Discharge Summary  Patient ID: Brandy Rivera MRN: [id number] DOB/AGE: 10/05/1960 57 y.o.  Admit date: 02/20/2018 Discharge date: 02/21/2018  Admission Diagnoses: S/p TAH BSO  Discharge Diagnoses:  Active Problems:   S/P hysterectomy   S/P abdominal supracervical subtotal hysterectomy   Discharged Condition: good  Hospital Course: unremarkable  Consults: None  Significant Diagnostic Studies: labs:  Results for orders placed or performed during the hospital encounter of 02/20/18 (from the past 24 hour(s))  CBC     Status: Abnormal   Collection Time: 02/21/18  5:09 AM  Result Value Ref Range   WBC 9.5 4.0 - 10.5 K/uL   RBC 2.88 (L) 3.87 - 5.11 MIL/uL   Hemoglobin 8.2 (L) 12.0 - 15.0 g/dL   HCT 27.6 (L) 36.0 - 46.0 %   MCV 95.8 80.0 - 100.0 fL   MCH 28.5 26.0 - 34.0 pg   MCHC 29.7 (L) 30.0 - 36.0 g/dL   RDW 15.9 (H) 11.5 - 15.5 %   Platelets 405 (H) 150 - 400 K/uL   nRBC 0.0 0.0 - 0.2 %  Basic metabolic panel     Status: Abnormal   Collection Time: 02/21/18  5:09 AM  Result Value Ref Range   Sodium 137 135 - 145 mmol/L   Potassium 4.1 3.5 - 5.1 mmol/L   Chloride 109 98 - 111 mmol/L   CO2 24 22 - 32 mmol/L   Glucose, Bld 109 (H) 70 - 99 mg/dL   BUN 8 6 - 20 mg/dL   Creatinine, Ser 0.80 0.44 - 1.00 mg/dL   Calcium 8.1 (L) 8.9 - 10.3 mg/dL   GFR calc non Af Amer >60 >60 mL/min   GFR calc Af Amer >60 >60 mL/min   Anion gap 4 (L) 5 - 15    Treatments: surgery: TAH BSO  Discharge Exam: Blood pressure (!) 120/46, pulse 74, temperature 98.2 F (36.8 C), temperature source Oral, resp. rate 18, SpO2 96 %. Normal post op exam Incision clean dry intact  Disposition: Discharge disposition: 01-Home or Self Care       Discharge Instructions    Call MD for:  persistant nausea and vomiting   Complete by:  As directed    Call MD for:  severe uncontrolled pain   Complete by:  As directed    Call MD for:  temperature >100.4   Complete by:  As directed    Diet  - low sodium heart healthy   Complete by:  As directed    Driving Restrictions   Complete by:  As directed    No driving for 1 week   Increase activity slowly   Complete by:  As directed    Leave dressing on - Keep it clean, dry, and intact until clinic visit   Complete by:  As directed    Lifting restrictions   Complete by:  As directed    Do not lift more than 10 pounds for 6 weeks   Sexual Activity Restrictions   Complete by:  As directed    No sex for 6 weeks     Allergies as of 02/21/2018   No Known Allergies     Medication List    STOP taking these medications   ferrous sulfate 325 (65 FE) MG tablet   ibuprofen 200 MG tablet Commonly known as:  ADVIL,MOTRIN   megestrol 40 MG tablet Commonly known as:  MEGACE     TAKE these medications   BELVIQ 10 MG Tabs Generic drug:  Lorcaserin HCl Take 1 tablet by mouth daily.   ketorolac 10 MG tablet Commonly known as:  TORADOL Take 1 tablet (10 mg total) by mouth every 8 (eight) hours as needed.   ondansetron 8 MG tablet Commonly known as:  ZOFRAN Take 1 tablet (8 mg total) by mouth every 6 (six) hours as needed for nausea.   oxyCODONE-acetaminophen 7.5-325 MG tablet Commonly known as:  PERCOCET Take 1-2 tablets by mouth every 6 (six) hours as needed.   venlafaxine 75 MG tablet Commonly known as:  EFFEXOR Take 75 mg by mouth 2 (two) times daily with a meal.      Follow-up Information    Florian Buff, MD Follow up on 02/28/2018.   Specialties:  Obstetrics and Gynecology, Radiology Why:  post op Contact information: [place] [address] [phone]           Signed: Florian Buff 02/21/2018, 12:40 PM

## 2018-02-21 NOTE — Progress Notes (Signed)
Patient ambulated in hallway with minimal assistance over 200 feet.  Patient tolerated walk well.

## 2018-02-21 NOTE — Progress Notes (Signed)
Discharge instructions reviewed with patient. AVS and prescription given. Pt aware that toradol and zofran prescriptions were already sent to her pharmacy by MD. Verbalized understanding of post-op instructions, f/u appointment next week and when to seek medical attention. IV site d/c'd, site within normal limits. No c/o pain or discomfort at time of discharge. Pt left floor in stable condition via w/c accompanied by nurse tech. Discharged home. Donavan Foil, RN

## 2018-02-21 NOTE — Discharge Instructions (Signed)
Abdominal Hysterectomy, Care After °This sheet gives you information about how to care for yourself after your procedure. Your health care provider may also give you more specific instructions. If you have problems or questions, contact your health care provider. °What can I expect after the procedure? °After your procedure, it is common to have: °· Pain. °· Fatigue. °· Poor appetite. °· Less interest in sex. °· Vaginal bleeding and discharge. You may need to use a sanitary napkin after this procedure. ° °Follow these instructions at home: °Bathing °· Do not take baths, swim, or use a hot tub until your health care provider approves. Ask your health care provider if you can take showers. You may only be allowed to take sponge baths for bathing. °· Keep the bandage (dressing) dry until your health care provider says it can be removed. °Incision care °· Follow instructions from your health care provider about how to take care of your incision. Make sure you: °? Wash your hands with soap and water before you change your bandage (dressing). If soap and water are not available, use hand sanitizer. °? Change your dressing as told by your health care provider. °? Leave stitches (sutures), skin glue, or adhesive strips in place. These skin closures may need to stay in place for 2 weeks or longer. If adhesive strip edges start to loosen and curl up, you may trim the loose edges. Do not remove adhesive strips completely unless your health care provider tells you to do that. °· Check your incision area every day for signs of infection. Check for: °? Redness, swelling, or pain. °? Fluid or blood. °? Warmth. °? Pus or a bad smell. °Activity °· Do gentle, daily exercises as told by your health care provider. You may be told to take short walks every day and go farther each time. °· Do not lift anything that is heavier than 10 lb (4.5 kg), or the limit that your health care provider tells you, until he or she says that it is  safe. °· Do not drive or use heavy machinery while taking prescription pain medicine. °· Do not drive for 24 hours if you were given a medicine to help you relax (sedative). °· Follow your health care provider's instructions about exercise, driving, and general activities. Ask your health care provider what activities are safe for you. °Lifestyle °· Do not douche, use tampons, or have sex for at least 6 weeks or as told by your health care provider. °· Do not drink alcohol until your health care provider approves. °· Drink enough fluid to keep your urine clear or pale yellow. °· Try to have someone at home with you for the first 1-2 weeks to help. °· Do not use any products that contain nicotine or tobacco, such as cigarettes and e-cigarettes. These can delay healing. If you need help quitting, ask your health care provider. °General instructions °· Take over-the-counter and prescription medicines only as told by your health care provider. °· Do not take aspirin or ibuprofen. These medicines can cause bleeding. °· To prevent or treat constipation while you are taking prescription pain medicine, your health care provider may recommend that you: °? Drink enough fluid to keep your urine clear or pale yellow. °? Take over-the-counter or prescription medicines. °? Eat foods that are high in fiber, such as fresh fruits and vegetables, whole grains, and beans. °? Limit foods that are high in fat and processed sugars, such as fried and sweet foods. °· Keep all   follow-up visits as told by your health care provider. This is important. °Contact a health care provider if: °· You have chills or fever. °· You have redness, swelling, or pain around your incision. °· You have fluid or blood coming from your incision. °· Your incision feels warm to the touch. °· You have pus or a bad smell coming from your incision. °· Your incision breaks open. °· You feel dizzy or light-headed. °· You have pain or bleeding when you urinate. °· You  have persistent diarrhea. °· You have persistent nausea and vomiting. °· You have abnormal vaginal discharge. °· You have a rash. °· You have any type of abnormal reaction or you develop an allergy to your medicine. °· Your pain medicine does not help. °Get help right away if: °· You have a fever and your symptoms suddenly get worse. °· You have severe abdominal pain. °· You have shortness of breath. °· You faint. °· You have pain, swelling, or redness in your leg. °· You have heavy vaginal bleeding with blood clots. °Summary °· After your procedure, it is common to have pain, fatigue and vaginal discharge. °· Do not take baths, swim, or use a hot tub until your health care provider approves. Ask your health care provider if you can take showers. You may only be allowed to take sponge baths for bathing. °· Follow your health care provider's instructions about exercise, driving, and general activities. Ask your health care provider what activities are safe for you. °· Do not lift anything that is heavier than 10 lb (4.5 kg), or the limit that your health care provider tells you, until he or she says that it is safe. °· Try to have someone at home with you for the first 1-2 weeks to help. °This information is not intended to replace advice given to you by your health care provider. Make sure you discuss any questions you have with your health care provider. °Document Released: 11/04/2004 Document Revised: 04/05/2016 Document Reviewed: 04/05/2016 °Elsevier Interactive Patient Education © 2017 [employer] ° °

## 2018-02-28 ENCOUNTER — Encounter: Payer: BLUE CROSS/BLUE SHIELD | Admitting: Obstetrics & Gynecology

## 2018-03-05 ENCOUNTER — Ambulatory Visit (INDEPENDENT_AMBULATORY_CARE_PROVIDER_SITE_OTHER): Payer: BLUE CROSS/BLUE SHIELD | Admitting: Obstetrics & Gynecology

## 2018-03-05 ENCOUNTER — Encounter: Payer: Self-pay | Admitting: Obstetrics & Gynecology

## 2018-03-05 VITALS — BP 125/62 | HR 90 | Ht 64.0 in | Wt 257.0 lb

## 2018-03-05 DIAGNOSIS — Z90710 Acquired absence of both cervix and uterus: Secondary | ICD-10-CM

## 2018-03-05 MED ORDER — OXYCODONE-ACETAMINOPHEN 7.5-325 MG PO TABS: 1.0000 | ORAL_TABLET | Freq: Four times a day (QID) | ORAL | 0 refills | Status: DC | PRN

## 2018-03-05 NOTE — Progress Notes (Signed)
  HPI: Patient returns for routine postoperative follow-up having undergone suprcervical abdominal hysterectomy 02/20/2018.  The patient's immediate postoperative recovery has been unremarkable. Since hospital discharge the patient reports no problems.   Current Outpatient Medications: BELVIQ 10 MG TABS, Take 1 tablet by mouth daily., Disp: , Rfl: 3 venlafaxine (EFFEXOR) 75 MG tablet, Take 75 mg by mouth 2 (two) times daily with a meal. , Disp: , Rfl: 2 ketorolac (TORADOL) 10 MG tablet, Take 1 tablet (10 mg total) by mouth every 8 (eight) hours as needed. (Patient not taking: Reported on 03/05/2018), Disp: 15 tablet, Rfl: 0 ondansetron (ZOFRAN) 8 MG tablet, Take 1 tablet (8 mg total) by mouth every 6 (six) hours as needed for nausea. (Patient not taking: Reported on 03/05/2018), Disp: 20 tablet, Rfl: 0 oxyCODONE-acetaminophen (PERCOCET) 7.5-325 MG tablet, Take 1-2 tablets by mouth every 6 (six) hours as needed. (Patient not taking: Reported on 03/05/2018), Disp: 30 tablet, Rfl: 0  No current facility-administered medications for this visit.     Blood pressure 125/62, pulse 90, height 5\' 4"  (1.626 m), weight 257 lb (116.6 kg).  Physical Exam: Incision clean dry intact +yeast  Staples removed painted with gentian violet  Diagnostic Tests:   Pathology: benign  Impression: S/p supracervical hysterectomy and BSO  Plan: Meds ordered this encounter  Medications  . oxyCODONE-acetaminophen (PERCOCET) 7.5-325 MG tablet    Sig: Take 1 tablet by mouth every 6 (six) hours as needed.    Dispense:  20 tablet    Refill:  0     Follow up: 1  weeks  Florian Buff, MD

## 2018-03-12 ENCOUNTER — Encounter: Payer: BLUE CROSS/BLUE SHIELD | Admitting: Obstetrics & Gynecology

## 2018-03-14 ENCOUNTER — Ambulatory Visit (INDEPENDENT_AMBULATORY_CARE_PROVIDER_SITE_OTHER): Payer: BLUE CROSS/BLUE SHIELD | Admitting: Obstetrics & Gynecology

## 2018-03-14 ENCOUNTER — Encounter: Payer: Self-pay | Admitting: Obstetrics & Gynecology

## 2018-03-14 ENCOUNTER — Other Ambulatory Visit: Payer: Self-pay

## 2018-03-14 VITALS — BP 125/71 | HR 85 | Ht 64.0 in | Wt 252.0 lb

## 2018-03-14 DIAGNOSIS — Z90710 Acquired absence of both cervix and uterus: Secondary | ICD-10-CM

## 2018-03-14 NOTE — Progress Notes (Signed)
  HPI: Patient returns for routine postoperative follow-up having undergone suprcervical abdominal hysterectomy 02/20/2018.  The patient's immediate postoperative recovery has been unremarkable. Since hospital discharge the patient reports no problems.   Current Outpatient Medications: BELVIQ 10 MG TABS, Take 1 tablet by mouth daily., Disp: , Rfl: 3 venlafaxine (EFFEXOR) 75 MG tablet, Take 75 mg by mouth 2 (two) times daily with a meal. , Disp: , Rfl: 2 ketorolac (TORADOL) 10 MG tablet, Take 1 tablet (10 mg total) by mouth every 8 (eight) hours as needed. (Patient not taking: Reported on 03/05/2018), Disp: 15 tablet, Rfl: 0 ondansetron (ZOFRAN) 8 MG tablet, Take 1 tablet (8 mg total) by mouth every 6 (six) hours as needed for nausea. (Patient not taking: Reported on 03/05/2018), Disp: 20 tablet, Rfl: 0 oxyCODONE-acetaminophen (PERCOCET) 7.5-325 MG tablet, Take 1-2 tablets by mouth every 6 (six) hours as needed. (Patient not taking: Reported on 03/05/2018), Disp: 30 tablet, Rfl: 0  No current facility-administered medications for this visit.     Blood pressure 125/62, pulse 90, height 5\' 4"  (1.626 m), weight 257 lb (116.6 kg).  Physical Exam: Incision clean dry intact +yeast  Staples removed painted with gentian violet  Diagnostic Tests:   Pathology: benign  Impression: S/p supracervical hysterectomy and BSO  Plan: No orders of the defined types were placed in this encounter.    Follow up: 2 weeks  Florian Buff, MD

## 2018-03-22 ENCOUNTER — Telehealth: Payer: Self-pay | Admitting: Obstetrics & Gynecology

## 2018-03-22 NOTE — Telephone Encounter (Signed)
Patient is requesting a refill on Belviq.  [facility] in [place], [place]

## 2018-03-22 NOTE — Telephone Encounter (Signed)
LMOVM that I would send her refill request to a provider and she could check with her pharmacy in the next 24 hours.

## 2018-03-24 ENCOUNTER — Telehealth: Payer: Self-pay | Admitting: Obstetrics & Gynecology

## 2018-03-24 MED ORDER — BELVIQ 10 MG PO TABS: 1.0000 | ORAL_TABLET | Freq: Every day | ORAL | 3 refills | Status: DC

## 2018-03-24 NOTE — Telephone Encounter (Signed)
done

## 2018-03-27 ENCOUNTER — Telehealth: Payer: Self-pay | Admitting: Obstetrics & Gynecology

## 2018-03-27 NOTE — Telephone Encounter (Signed)
Pt would like for a nurse to give her a call

## 2018-03-27 NOTE — Telephone Encounter (Signed)
Pt called stating that pharmacy sent [place] request for PA on belviq but she is self pay. Advised to call pharmacy and have them run it as such. Pt verbalized understanding

## 2018-04-01 ENCOUNTER — Encounter: Payer: Self-pay | Admitting: Obstetrics & Gynecology

## 2018-04-01 ENCOUNTER — Ambulatory Visit (INDEPENDENT_AMBULATORY_CARE_PROVIDER_SITE_OTHER): Payer: BLUE CROSS/BLUE SHIELD | Admitting: Obstetrics & Gynecology

## 2018-04-01 VITALS — BP 109/67 | HR 76 | Ht 64.0 in | Wt 253.0 lb

## 2018-04-01 DIAGNOSIS — R739 Hyperglycemia, unspecified: Secondary | ICD-10-CM

## 2018-04-01 DIAGNOSIS — Z90710 Acquired absence of both cervix and uterus: Secondary | ICD-10-CM

## 2018-04-01 NOTE — Progress Notes (Signed)
  HPI: Patient returns for routine postoperative follow-up having undergone TAHBSO on 02/20/2018.  The patient's immediate postoperative recovery has been unremarkable. Since hospital discharge the patient reports no problems.   Current Outpatient Medications: BELVIQ 10 MG TABS, Take 1 tablet by mouth daily., Disp: 60 tablet, Rfl: 3 venlafaxine (EFFEXOR) 75 MG tablet, Take 75 mg by mouth 2 (two) times daily with a meal. , Disp: , Rfl: 2 ketorolac (TORADOL) 10 MG tablet, Take 1 tablet (10 mg total) by mouth every 8 (eight) hours as needed. (Patient not taking: Reported on 03/05/2018), Disp: 15 tablet, Rfl: 0 ondansetron (ZOFRAN) 8 MG tablet, Take 1 tablet (8 mg total) by mouth every 6 (six) hours as needed for nausea. (Patient not taking: Reported on 03/05/2018), Disp: 20 tablet, Rfl: 0 oxyCODONE-acetaminophen (PERCOCET) 7.5-325 MG tablet, Take 1 tablet by mouth every 6 (six) hours as needed. (Patient not taking: Reported on 03/14/2018), Disp: 20 tablet, Rfl: 0  No current facility-administered medications for this visit.     Blood pressure 109/67, pulse 76, height 5\' 4"  (1.626 m), weight 253 lb (114.8 kg).  Physical Exam: Incision clean dry intact  Diagnostic Tests:   Pathology: benign  Impression: S/p TAHBSO  Plan: Check hemoglobin A1C  Follow up: 1  years  Florian Buff, MD

## 2018-04-02 LAB — HEMOGLOBIN A1C
Est. average glucose Bld gHb Est-mCnc: 105 mg/dL
Hgb A1c MFr Bld: 5.3 % (ref 4.8–5.6)

## 2018-06-18 ENCOUNTER — Ambulatory Visit: Payer: BLUE CROSS/BLUE SHIELD | Admitting: Obstetrics & Gynecology

## 2018-06-18 ENCOUNTER — Encounter: Payer: Self-pay | Admitting: Obstetrics & Gynecology

## 2018-06-18 VITALS — BP 110/68 | HR 84 | Ht 64.0 in | Wt 258.0 lb

## 2018-06-18 DIAGNOSIS — N951 Menopausal and female climacteric states: Secondary | ICD-10-CM

## 2018-06-18 MED ORDER — ESTROGENS CONJUGATED 0.625 MG PO TABS: ORAL_TABLET | ORAL | 3 refills | Status: DC

## 2018-06-18 NOTE — Progress Notes (Signed)
Chief Complaint  Patient presents with  . Follow-up      58 y.o. [id number] No LMP recorded. Patient has had a hysterectomy. The current method of family planning is status post hysterectomy.  Outpatient Encounter Medications as of 06/18/2018  Medication Sig  . BELVIQ 10 MG TABS Take 1 tablet by mouth daily.  Marland Kitchen venlafaxine (EFFEXOR) 75 MG tablet Take 75 mg by mouth 2 (two) times daily with a meal.   . estrogens, conjugated, (PREMARIN) 0.625 MG tablet Take 1 daily  . ketorolac (TORADOL) 10 MG tablet Take 1 tablet (10 mg total) by mouth every 8 (eight) hours as needed. (Patient not taking: Reported on 03/05/2018)  . ondansetron (ZOFRAN) 8 MG tablet Take 1 tablet (8 mg total) by mouth every 6 (six) hours as needed for nausea. (Patient not taking: Reported on 03/05/2018)  . oxyCODONE-acetaminophen (PERCOCET) 7.5-325 MG tablet Take 1 tablet by mouth every 6 (six) hours as needed. (Patient not taking: Reported on 03/14/2018)   No facility-administered encounter medications on file as of 06/18/2018.     Subjective Pt states that since her hysterectomy she has had difficulty sleeping feels foggy anhedonia does not feel herself She is on belviq and effexor and continues on those at present No real vasomotor symptoms Severity is moderate  Of course pt was menopausal prior to her hysterectomy Past Medical History:  Diagnosis Date  . Anemia   . Arthritis   . Depression   . Mental disorder    depression    Past Surgical History:  Procedure Laterality Date  . CESAREAN SECTION    . ENDOMETRIAL ABLATION    . HERNIA REPAIR Right    inguinal  . SALPINGOOPHORECTOMY Bilateral 02/20/2018   Procedure: BILATERAL SALPINGO OOPHORECTOMY;  Surgeon: Florian Buff, MD;  Location: [facility];  Service: Gynecology;  Laterality: Bilateral;  . SUPRACERVICAL ABDOMINAL HYSTERECTOMY N/A 02/20/2018   Procedure: HYSTERECTOMY SUPRACERVICAL ABDOMINAL;  Surgeon: Florian Buff, MD;  Location: [facility];  Service:  Gynecology;  Laterality: N/A;    OB History    Gravida  4   Para  3   Term  3   Preterm      AB  1   Living  2     SAB      TAB      Ectopic  1   Multiple      Live Births  2           No Known Allergies  Social History   Socioeconomic History  . Marital status: Divorced    Spouse name: Not on file  . Number of children: Not on file  . Years of education: Not on file  . Highest education level: Not on file  Occupational History  . Not on file  Social Needs  . Financial resource strain: Not on file  . Food insecurity:    Worry: Not on file    Inability: Not on file  . Transportation needs:    Medical: Not on file    Non-medical: Not on file  Tobacco Use  . Smoking status: Current Every Day Smoker    Packs/day: 0.50    Years: 40.00    Pack years: 20.00    Types: Cigarettes  . Smokeless tobacco: Never Used  Substance and Sexual Activity  . Alcohol use: Not Currently  . Drug use: Never  . Sexual activity: Not Currently    Birth control/protection: Surgical    Comment: ablation  Lifestyle  . Physical activity:    Days per week: Not on file    Minutes per session: Not on file  . Stress: Not on file  Relationships  . Social connections:    Talks on phone: Not on file    Gets together: Not on file    Attends religious service: Not on file    Active member of club or organization: Not on file    Attends meetings of clubs or organizations: Not on file    Relationship status: Not on file  Other Topics Concern  . Not on file  Social History Narrative  . Not on file    Family History  Problem Relation Age of Onset  . Cancer Father        stomach  . Hypertension Mother   . Diabetes Brother   . Hypertension Sister   . Miscarriages / [place] Daughter     Medications:       Current Outpatient Medications:  .  BELVIQ 10 MG TABS, Take 1 tablet by mouth daily., Disp: 60 tablet, Rfl: 3 .  venlafaxine (EFFEXOR) 75 MG tablet, Take 75 mg by  mouth 2 (two) times daily with a meal. , Disp: , Rfl: 2 .  estrogens, conjugated, (PREMARIN) 0.625 MG tablet, Take 1 daily, Disp: 30 tablet, Rfl: 3 .  ketorolac (TORADOL) 10 MG tablet, Take 1 tablet (10 mg total) by mouth every 8 (eight) hours as needed. (Patient not taking: Reported on 03/05/2018), Disp: 15 tablet, Rfl: 0 .  ondansetron (ZOFRAN) 8 MG tablet, Take 1 tablet (8 mg total) by mouth every 6 (six) hours as needed for nausea. (Patient not taking: Reported on 03/05/2018), Disp: 20 tablet, Rfl: 0 .  oxyCODONE-acetaminophen (PERCOCET) 7.5-325 MG tablet, Take 1 tablet by mouth every 6 (six) hours as needed. (Patient not taking: Reported on 03/14/2018), Disp: 20 tablet, Rfl: 0  Objective Blood pressure 110/68, pulse 84, height 5\' 4"  (1.626 m), weight 258 lb (117 kg).  Gen WDWN well groomed NAD  Pertinent ROS No burning with urination, frequency or urgency No nausea, vomiting or diarrhea Nor fever chills or other constitutional symptoms   Labs or studies     Impression Diagnoses this Encounter::   ICD-10-CM   1. Menopausal symptoms N95.1     Established relevant diagnosis(es):   Plan/Recommendations: Meds ordered this encounter  Medications  . estrogens, conjugated, (PREMARIN) 0.625 MG tablet    Sig: Take 1 daily    Dispense:  30 tablet    Refill:  3    Labs or Scans Ordered: No orders of the defined types were placed in this encounter.   Management:: Try a course of premarin 0.625 mg to see if her symptoms are in any way hormone mediated  Follow up Return in about 6 weeks (around 07/30/2018) for Follow up, with Dr Elonda Husky.        Face to face time:  15 minutes  Greater than 50% of the visit time was spent in counseling and coordination of care with the patient.  The summary and outline of the counseling and care coordination is summarized in the note above.   All questions were answered.

## 2018-07-03 NOTE — Telephone Encounter (Signed)
Note sent to nurse. 

## 2018-07-11 ENCOUNTER — Other Ambulatory Visit: Payer: Self-pay | Admitting: Obstetrics & Gynecology

## 2018-07-11 ENCOUNTER — Telehealth: Payer: Self-pay | Admitting: Obstetrics & Gynecology

## 2018-07-11 MED ORDER — ESTRADIOL 2 MG PO TABS: 2.0000 mg | ORAL_TABLET | Freq: Every day | ORAL | 11 refills | Status: DC

## 2018-07-11 NOTE — Telephone Encounter (Signed)
Patient called and is requesting something to replace Belviq.  [place]  [phone]

## 2018-07-11 NOTE — Telephone Encounter (Signed)
Pt requesting an alternate medication than belviq. Advised that Dr Elonda Husky does not prescribe diet pills. Pt verbalized understanding.

## 2018-07-11 NOTE — Telephone Encounter (Signed)
I e prescribe estrace 2 mg po

## 2018-07-30 ENCOUNTER — Ambulatory Visit (INDEPENDENT_AMBULATORY_CARE_PROVIDER_SITE_OTHER): Payer: BLUE CROSS/BLUE SHIELD | Admitting: Obstetrics & Gynecology

## 2018-07-30 ENCOUNTER — Other Ambulatory Visit: Payer: Self-pay

## 2018-07-30 ENCOUNTER — Encounter: Payer: Self-pay | Admitting: Obstetrics & Gynecology

## 2018-07-30 DIAGNOSIS — L659 Nonscarring hair loss, unspecified: Secondary | ICD-10-CM

## 2018-07-30 DIAGNOSIS — N951 Menopausal and female climacteric states: Secondary | ICD-10-CM | POA: Diagnosis not present

## 2018-07-30 MED ORDER — ESTROGENS CONJUGATED 0.625 MG PO TABS: ORAL_TABLET | ORAL | 11 refills | Status: DC

## 2018-07-30 NOTE — Progress Notes (Signed)
   TELEHEALTH VIRTUAL GYNECOLOGY VISIT ENCOUNTER NOTE  I connected with Brandy Rivera on 07/30/18 at  8:45 AM EDT by telephone at home and verified that I am speaking with the correct person using two identifiers.   I discussed the limitations, risks, security and privacy concerns of performing an evaluation and management service by telephone and the availability of in person appointments. I also discussed with the patient that there may be a patient responsible charge related to this service. The patient expressed understanding and agreed to proceed.   History:  Brandy Rivera is a 58 y.o. [phone] female being evaluated today for response to her ERT and her hair loss. She denies any abnormal vaginal discharge, bleeding, pelvic pain or other concerns.    Her menopausal symptoms are much better her hair loss continues She feels like herself again   Past Medical History:  Diagnosis Date  . Anemia   . Arthritis   . Depression   . Mental disorder    depression   Past Surgical History:  Procedure Laterality Date  . CESAREAN SECTION    . ENDOMETRIAL ABLATION    . HERNIA REPAIR Right    inguinal  . SALPINGOOPHORECTOMY Bilateral 02/20/2018   Procedure: BILATERAL SALPINGO OOPHORECTOMY;  Surgeon: Florian Buff, MD;  Location: [facility];  Service: Gynecology;  Laterality: Bilateral;  . SUPRACERVICAL ABDOMINAL HYSTERECTOMY N/A 02/20/2018   Procedure: HYSTERECTOMY SUPRACERVICAL ABDOMINAL;  Surgeon: Florian Buff, MD;  Location: [facility];  Service: Gynecology;  Laterality: N/A;   The following portions of the patient's history were reviewed and updated as appropriate: allergies, current medications, past family history, past medical history, past social history, past surgical history and problem list.   Health Maintenance:  Normal pap and negative HRHPV on N/A had hysterectomy  Normal mammogram on .   Review of Systems:  Pertinent items noted in HPI and remainder of comprehensive ROS otherwise negative.   Physical Exam:  Physical exam deferred due to nature of the encounter  Labs and Imaging No results found for this or any previous visit (from the past 336 hour(s)). No results found.     Meds ordered this encounter  Medications  . estrogens, conjugated, (PREMARIN) 0.625 MG tablet    Sig: Take 1 daily    Dispense:  30 tablet    Refill:  11   Orders Placed This Encounter  Procedures  . TSH   Assessment and Plan:     Menopausal symptoms  Hair loss - Plan: TSH  >Continue premarin >TSH to be done in 4-6 weeks if hair loss continues >Use MyChart to communicate any problems or issues in the meantime      I discussed the assessment and treatment plan with the patient. The patient was provided an opportunity to ask questions and all were answered. The patient agreed with the plan and demonstrated an understanding of the instructions.   The patient was advised to call back or seek an in-person evaluation/go to the ED if the symptoms worsen or if the condition fails to improve as anticipated.  I provided 15 minutes of non-face-to-face time during this encounter.   Florian Buff, MD [facility], [place]

## 2018-11-11 ENCOUNTER — Telehealth: Payer: Self-pay | Admitting: Obstetrics & Gynecology

## 2018-11-11 NOTE — Telephone Encounter (Signed)
Please call pt she wants to see about [facility] refilling some meds that he had not actually first gave her. Please call

## 2018-11-14 ENCOUNTER — Telehealth: Payer: Self-pay | Admitting: Obstetrics & Gynecology

## 2018-11-14 MED ORDER — VENLAFAXINE HCL 75 MG PO TABS: 75.0000 mg | ORAL_TABLET | Freq: Two times a day (BID) | ORAL | 4 refills | Status: DC

## 2018-12-16 ENCOUNTER — Telehealth: Payer: Self-pay | Admitting: Obstetrics & Gynecology

## 2018-12-16 NOTE — Telephone Encounter (Signed)
Pt states she would like to come in the have her thyroid checked. Please advise.

## 2018-12-17 ENCOUNTER — Other Ambulatory Visit: Payer: Self-pay

## 2018-12-17 ENCOUNTER — Other Ambulatory Visit: Payer: BC Managed Care – PPO

## 2018-12-17 DIAGNOSIS — N951 Menopausal and female climacteric states: Secondary | ICD-10-CM

## 2018-12-18 LAB — TSH: TSH: 1.79 u[IU]/mL (ref 0.450–4.500)

## 2019-01-07 ENCOUNTER — Telehealth: Payer: Self-pay | Admitting: *Deleted

## 2019-01-07 NOTE — Telephone Encounter (Signed)
Patient calling for TSH results.  Informed lab normal.  Verbalized understanding.

## 2019-07-01 ENCOUNTER — Other Ambulatory Visit (HOSPITAL_COMMUNITY)
Admission: RE | Admit: 2019-07-01 | Discharge: 2019-07-01 | Disposition: A | Discharge status: Home / Self Care | Payer: BLUE CROSS/BLUE SHIELD | Source: Ambulatory Visit | Attending: Obstetrics & Gynecology | Admitting: Obstetrics & Gynecology

## 2019-07-01 ENCOUNTER — Ambulatory Visit (INDEPENDENT_AMBULATORY_CARE_PROVIDER_SITE_OTHER): Payer: BLUE CROSS/BLUE SHIELD | Admitting: Obstetrics & Gynecology

## 2019-07-01 ENCOUNTER — Encounter: Payer: Self-pay | Admitting: Obstetrics & Gynecology

## 2019-07-01 ENCOUNTER — Other Ambulatory Visit: Payer: Self-pay

## 2019-07-01 VITALS — BP 116/64 | HR 69 | Ht 64.0 in | Wt 275.0 lb

## 2019-07-01 DIAGNOSIS — Z01419 Encounter for gynecological examination (general) (routine) without abnormal findings: Secondary | ICD-10-CM | POA: Diagnosis not present

## 2019-07-01 MED ORDER — ESTROGENS CONJUGATED 0.625 MG PO TABS: ORAL_TABLET | ORAL | 11 refills | Status: DC

## 2019-07-01 NOTE — Progress Notes (Signed)
Subjective:     Brandy Rivera is a 59 y.o. female here for a routine exam.  No LMP recorded. Patient has had a hysterectomy. [id number] Birth Control Method:  hysterectomy Menstrual Calendar(currently): amenorrheic  Current complaints: sleep deprivation.   Current acute medical issues:  none   Recent Gynecologic History No LMP recorded. Patient has had a hysterectomy. Last Pap: 2019,  normal Last mammogram: 2014,  normal  Past Medical History:  Diagnosis Date  . Anemia   . Arthritis   . Depression   . Mental disorder    depression    Past Surgical History:  Procedure Laterality Date  . CESAREAN SECTION    . ENDOMETRIAL ABLATION    . HERNIA REPAIR Right    inguinal  . SALPINGOOPHORECTOMY Bilateral 02/20/2018   Procedure: BILATERAL SALPINGO OOPHORECTOMY;  Surgeon: Florian Buff, MD;  Location: [facility];  Service: Gynecology;  Laterality: Bilateral;  . SUPRACERVICAL ABDOMINAL HYSTERECTOMY N/A 02/20/2018   Procedure: HYSTERECTOMY SUPRACERVICAL ABDOMINAL;  Surgeon: Florian Buff, MD;  Location: [facility];  Service: Gynecology;  Laterality: N/A;    OB History    Gravida  4   Para  3   Term  3   Preterm      AB  1   Living  2     SAB      TAB      Ectopic  1   Multiple      Live Births  2           Social History   Socioeconomic History  . Marital status: Divorced    Spouse name: Not on file  . Number of children: Not on file  . Years of education: Not on file  . Highest education level: Not on file  Occupational History  . Not on file  Tobacco Use  . Smoking status: Current Every Day Smoker    Packs/day: 0.50    Years: 40.00    Pack years: 20.00    Types: Cigarettes  . Smokeless tobacco: Never Used  Substance and Sexual Activity  . Alcohol use: Not Currently  . Drug use: Never  . Sexual activity: Not Currently    Birth control/protection: Surgical    Comment: ablation  Other Topics Concern  . Not on file  Social History Narrative  . Not on  file   Social Determinants of Health   Financial Resource Strain:   . Difficulty of Paying Living Expenses: Not on file  Food Insecurity:   . Worried About Charity fundraiser in the Last Year: Not on file  . Ran Out of Food in the Last Year: Not on file  Transportation Needs:   . Lack of Transportation (Medical): Not on file  . Lack of Transportation (Non-Medical): Not on file  Physical Activity:   . Days of Exercise per Week: Not on file  . Minutes of Exercise per Session: Not on file  Stress:   . Feeling of Stress : Not on file  Social Connections:   . Frequency of Communication with Friends and Family: Not on file  . Frequency of Social Gatherings with Friends and Family: Not on file  . Attends Religious Services: Not on file  . Active Member of Clubs or Organizations: Not on file  . Attends Archivist Meetings: Not on file  . Marital Status: Not on file    Family History  Problem Relation Age of Onset  . Cancer Father  stomach  . Hypertension Mother   . Diabetes Brother   . Hypertension Sister   . Miscarriages / [place] Daughter      Current Outpatient Medications:  .  estrogens, conjugated, (PREMARIN) 0.625 MG tablet, Take 1 daily, Disp: 30 tablet, Rfl: 11 .  venlafaxine (EFFEXOR) 75 MG tablet, Take 1 tablet (75 mg total) by mouth 2 (two) times daily with a meal., Disp: 60 tablet, Rfl: 4  Review of Systems  Review of Systems  Constitutional: Negative for fever, chills, weight loss, malaise/fatigue and diaphoresis.  HENT: Negative for hearing loss, ear pain, nosebleeds, congestion, sore throat, neck pain, tinnitus and ear discharge.   Eyes: Negative for blurred vision, double vision, photophobia, pain, discharge and redness.  Respiratory: Negative for cough, hemoptysis, sputum production, shortness of breath, wheezing and stridor.   Cardiovascular: Negative for chest pain, palpitations, orthopnea, claudication, leg swelling and PND.   Gastrointestinal: negative for abdominal pain. Negative for heartburn, nausea, vomiting, diarrhea, constipation, blood in stool and melena.  Genitourinary: Negative for dysuria, urgency, frequency, hematuria and flank pain.  Musculoskeletal: Negative for myalgias, back pain, joint pain and falls.  Skin: Negative for itching and rash.  Neurological: Negative for dizziness, tingling, tremors, sensory change, speech change, focal weakness, seizures, loss of consciousness, weakness and headaches.  Endo/Heme/Allergies: Negative for environmental allergies and polydipsia. Does not bruise/bleed easily.  Psychiatric/Behavioral: Negative for depression, suicidal ideas, hallucinations, memory loss and substance abuse. The patient is not nervous/anxious and does not have insomnia.        Objective:  Blood pressure 116/64, pulse 69, height 5\' 4"  (1.626 m), weight 275 lb (124.7 kg).   Physical Exam  Vitals reviewed. Constitutional: She is oriented to person, place, and time. She appears well-developed and well-nourished.  HENT:  Head: Normocephalic and atraumatic.        Right Ear: External ear normal.  Left Ear: External ear normal.  Nose: Nose normal.  Mouth/Throat: Oropharynx is clear and moist.  Eyes: Conjunctivae and EOM are normal. Pupils are equal, round, and reactive to light. Right eye exhibits no discharge. Left eye exhibits no discharge. No scleral icterus.  Neck: Normal range of motion. Neck supple. No tracheal deviation present. No thyromegaly present.  Cardiovascular: Normal rate, regular rhythm, normal heart sounds and intact distal pulses.  Exam reveals no gallop and no friction rub.   No murmur heard. Respiratory: Effort normal and breath sounds normal. No respiratory distress. She has no wheezes. She has no rales. She exhibits no tenderness.  GI: Soft. Bowel sounds are normal. She exhibits no distension and no mass. There is no tenderness. There is no rebound and no guarding.   Genitourinary:  Breasts no masses skin changes or nipple changes bilaterally      Vulva is normal without lesions Vagina is pink moist without discharge Cervix normal in appearance and pap is done Uterus is normal size shape and contour Adnexa is negative with normal sized ovaries   Musculoskeletal: Normal range of motion. She exhibits no edema and no tenderness.  Neurological: She is alert and oriented to person, place, and time. She has normal reflexes. She displays normal reflexes. No cranial nerve deficit. She exhibits normal muscle tone. Coordination normal.  Skin: Skin is warm and dry. No rash noted. No erythema. No pallor.  Psychiatric: She has a normal mood and affect. Her behavior is normal. Judgment and thought content normal.       Medications Ordered at today's visit: Meds ordered this encounter  Medications  .  DISCONTD: estrogens, conjugated, (PREMARIN) 0.625 MG tablet    Sig: Take 1 daily    Dispense:  30 tablet    Refill:  11  . estrogens, conjugated, (PREMARIN) 0.625 MG tablet    Sig: Take 1 daily    Dispense:  30 tablet    Refill:  11    Other orders placed at today's visit: No orders of the defined types were placed in this encounter.     Assessment:    Healthy female exam.    Plan:    Contraception: status post hysterectomy. Mammogram ordered. Follow up in: 1 year.     Return in about 1 year (around 06/30/2020) for yearly.

## 2019-07-04 LAB — CYTOLOGY - PAP
Comment: NEGATIVE
Diagnosis: NEGATIVE
High risk HPV: NEGATIVE

## 2019-08-04 DIAGNOSIS — Z1231 Encounter for screening mammogram for malignant neoplasm of breast: Secondary | ICD-10-CM | POA: Diagnosis not present

## 2019-08-24 DIAGNOSIS — M5489 Other dorsalgia: Secondary | ICD-10-CM | POA: Diagnosis not present

## 2019-08-24 DIAGNOSIS — R079 Chest pain, unspecified: Secondary | ICD-10-CM | POA: Diagnosis not present

## 2019-08-24 DIAGNOSIS — M1712 Unilateral primary osteoarthritis, left knee: Secondary | ICD-10-CM | POA: Diagnosis not present

## 2019-08-24 DIAGNOSIS — F17210 Nicotine dependence, cigarettes, uncomplicated: Secondary | ICD-10-CM | POA: Diagnosis not present

## 2019-08-24 DIAGNOSIS — R911 Solitary pulmonary nodule: Secondary | ICD-10-CM | POA: Diagnosis not present

## 2019-08-24 DIAGNOSIS — R55 Syncope and collapse: Secondary | ICD-10-CM | POA: Diagnosis not present

## 2019-08-24 DIAGNOSIS — M542 Cervicalgia: Secondary | ICD-10-CM | POA: Diagnosis not present

## 2019-08-24 DIAGNOSIS — S7002XA Contusion of left hip, initial encounter: Secondary | ICD-10-CM | POA: Diagnosis not present

## 2019-08-24 DIAGNOSIS — S335XXA Sprain of ligaments of lumbar spine, initial encounter: Secondary | ICD-10-CM | POA: Diagnosis not present

## 2019-08-24 DIAGNOSIS — M545 Low back pain: Secondary | ICD-10-CM | POA: Diagnosis not present

## 2019-08-24 DIAGNOSIS — S139XXA Sprain of joints and ligaments of unspecified parts of neck, initial encounter: Secondary | ICD-10-CM | POA: Diagnosis not present

## 2019-08-24 DIAGNOSIS — S0990XA Unspecified injury of head, initial encounter: Secondary | ICD-10-CM | POA: Diagnosis not present

## 2019-08-24 DIAGNOSIS — S8001XA Contusion of right knee, initial encounter: Secondary | ICD-10-CM | POA: Diagnosis not present

## 2019-08-24 DIAGNOSIS — S8002XA Contusion of left knee, initial encounter: Secondary | ICD-10-CM | POA: Diagnosis not present

## 2019-08-24 DIAGNOSIS — S134XXA Sprain of ligaments of cervical spine, initial encounter: Secondary | ICD-10-CM | POA: Diagnosis not present

## 2019-08-25 ENCOUNTER — Telehealth: Payer: Self-pay | Admitting: Obstetrics & Gynecology

## 2019-08-25 MED ORDER — TRIAMTERENE-HCTZ 37.5-25 MG PO CAPS: 1.0000 | ORAL_CAPSULE | Freq: Every day | ORAL | 3 refills | Status: DC

## 2019-08-25 NOTE — Telephone Encounter (Signed)
Tell her to watch her salt and I sent in a mild fluid pill

## 2019-08-25 NOTE — Telephone Encounter (Addendum)
Pt states her ankles and feet are swelling bad and she wonders if she needs a fluid pill. Please advise. Thanks!! [place]

## 2019-08-25 NOTE — Telephone Encounter (Signed)
Pt advised to watch salt intake and a mild fluid pill was sent to pharmacy. Pt voiced understanding. Hyman Bible

## 2019-08-25 NOTE — Telephone Encounter (Signed)
Pt is wanting to know if she should be put on a fluid pill. States her legs and ankles are swelling. Requests to speak with a nurse.

## 2019-09-09 ENCOUNTER — Telehealth: Payer: Self-pay | Admitting: Obstetrics & Gynecology

## 2019-09-09 NOTE — Telephone Encounter (Signed)
Pt inquires to see if dose on fluid pill can be increased. Wants a recommendation on family practitioner.

## 2019-09-10 ENCOUNTER — Other Ambulatory Visit: Payer: Self-pay | Admitting: Obstetrics & Gynecology

## 2019-09-10 MED ORDER — TRIAMTERENE-HCTZ 75-50 MG PO TABS: 1.0000 | ORAL_TABLET | Freq: Every day | ORAL | 11 refills | Status: DC

## 2019-09-10 NOTE — Telephone Encounter (Signed)
Telephoned patient at home number and left message to who to contact for PCP.

## 2019-09-10 NOTE — Telephone Encounter (Signed)
Best resource is to call [facility] site and find a provider  [email or web address]  Then click on find a provider and it will help her based on her address etc  Dr Elonda Husky

## 2019-10-20 ENCOUNTER — Telehealth: Payer: Self-pay | Admitting: Obstetrics & Gynecology

## 2019-10-20 MED ORDER — VENLAFAXINE HCL 75 MG PO TABS: 75.0000 mg | ORAL_TABLET | Freq: Two times a day (BID) | ORAL | 4 refills | Status: DC

## 2019-10-23 ENCOUNTER — Encounter: Payer: Self-pay | Admitting: Obstetrics & Gynecology

## 2019-10-23 ENCOUNTER — Ambulatory Visit: Payer: BC Managed Care – PPO | Admitting: Obstetrics & Gynecology

## 2019-10-23 VITALS — BP 128/76 | HR 83 | Ht 64.0 in | Wt 257.0 lb

## 2019-10-23 DIAGNOSIS — E876 Hypokalemia: Secondary | ICD-10-CM | POA: Diagnosis not present

## 2019-10-23 DIAGNOSIS — S8992XA Unspecified injury of left lower leg, initial encounter: Secondary | ICD-10-CM | POA: Diagnosis not present

## 2019-10-23 MED ORDER — VENLAFAXINE HCL ER 150 MG PO CP24: 150.0000 mg | ORAL_CAPSULE | Freq: Every day | ORAL | 3 refills | Status: DC
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
## 2019-10-24 LAB — COMPREHENSIVE METABOLIC PANEL
ALT: 26 IU/L (ref 0–32)
AST: 25 IU/L (ref 0–40)
Albumin/Globulin Ratio: 1.6 (ref 1.2–2.2)
Albumin: 4.3 g/dL (ref 3.8–4.9)
Alkaline Phosphatase: 84 IU/L (ref 48–121)
BUN/Creatinine Ratio: 18 (ref 9–23)
BUN: 15 mg/dL (ref 6–24)
Bilirubin Total: 0.2 mg/dL (ref 0.0–1.2)
CO2: 27 mmol/L (ref 20–29)
Calcium: 10.8 mg/dL — ABNORMAL HIGH (ref 8.7–10.2)
Chloride: 100 mmol/L (ref 96–106)
Creatinine, Ser: 0.83 mg/dL (ref 0.57–1.00)
GFR calc Af Amer: 91 mL/min/{1.73_m2} (ref >59)
GFR calc non Af Amer: 79 mL/min/{1.73_m2} (ref >59)
Globulin, Total: 2.7 g/dL (ref 1.5–4.5)
Glucose: 121 mg/dL — ABNORMAL HIGH (ref 65–99)
Potassium: 3.4 mmol/L — ABNORMAL LOW (ref 3.5–5.2)
Sodium: 139 mmol/L (ref 134–144)
Total Protein: 7 g/dL (ref 6.0–8.5)

## 2019-10-25 ENCOUNTER — Encounter: Payer: Self-pay | Admitting: Obstetrics & Gynecology

## 2019-10-29 ENCOUNTER — Encounter (HOSPITAL_COMMUNITY): Payer: Self-pay

## 2019-10-29 ENCOUNTER — Ambulatory Visit (HOSPITAL_COMMUNITY): Admission: RE | Admit: 2019-10-29 | Payer: BC Managed Care – PPO | Source: Ambulatory Visit

## 2019-11-05 ENCOUNTER — Ambulatory Visit (HOSPITAL_COMMUNITY)
Admission: RE | Admit: 2019-11-05 | Discharge: 2019-11-05 | Disposition: A | Discharge status: Home / Self Care | Payer: BC Managed Care – PPO | Source: Ambulatory Visit | Attending: Obstetrics & Gynecology | Admitting: Obstetrics & Gynecology

## 2019-11-05 ENCOUNTER — Other Ambulatory Visit: Payer: Self-pay

## 2019-11-05 DIAGNOSIS — S8992XA Unspecified injury of left lower leg, initial encounter: Secondary | ICD-10-CM | POA: Insufficient documentation

## 2019-12-13 DIAGNOSIS — Z20822 Contact with and (suspected) exposure to covid-19: Secondary | ICD-10-CM | POA: Diagnosis not present

## 2019-12-17 DIAGNOSIS — U071 COVID-19: Secondary | ICD-10-CM | POA: Diagnosis not present

## 2020-01-26 DIAGNOSIS — I10 Essential (primary) hypertension: Secondary | ICD-10-CM | POA: Diagnosis not present

## 2020-03-22 ENCOUNTER — Other Ambulatory Visit: Payer: Self-pay | Admitting: Obstetrics & Gynecology

## 2020-07-05 ENCOUNTER — Other Ambulatory Visit: Payer: BC Managed Care – PPO | Admitting: Obstetrics & Gynecology

## 2020-07-06 ENCOUNTER — Other Ambulatory Visit: Payer: Self-pay

## 2020-07-06 ENCOUNTER — Encounter: Payer: Self-pay | Admitting: Obstetrics & Gynecology

## 2020-07-06 ENCOUNTER — Ambulatory Visit: Payer: BC Managed Care – PPO | Admitting: Obstetrics & Gynecology

## 2020-07-06 ENCOUNTER — Other Ambulatory Visit (HOSPITAL_COMMUNITY)
Admission: RE | Admit: 2020-07-06 | Discharge: 2020-07-06 | Disposition: A | Discharge status: Home / Self Care | Payer: BC Managed Care – PPO | Source: Ambulatory Visit | Attending: Obstetrics & Gynecology | Admitting: Obstetrics & Gynecology

## 2020-07-06 VITALS — BP 130/64 | HR 86 | Ht 64.0 in | Wt 264.5 lb

## 2020-07-06 DIAGNOSIS — Z01419 Encounter for gynecological examination (general) (routine) without abnormal findings: Secondary | ICD-10-CM

## 2020-07-06 DIAGNOSIS — Z1212 Encounter for screening for malignant neoplasm of rectum: Secondary | ICD-10-CM | POA: Diagnosis not present

## 2020-07-06 DIAGNOSIS — Z1211 Encounter for screening for malignant neoplasm of colon: Secondary | ICD-10-CM | POA: Diagnosis not present

## 2020-07-06 DIAGNOSIS — N3941 Urge incontinence: Secondary | ICD-10-CM | POA: Diagnosis not present

## 2020-07-06 MED ORDER — ESTROGENS CONJUGATED 0.625 MG PO TABS: ORAL_TABLET | ORAL | 11 refills | Status: DC

## 2020-07-06 MED ORDER — SOLIFENACIN SUCCINATE 10 MG PO TABS: 10.0000 mg | ORAL_TABLET | Freq: Every day | ORAL | 11 refills | Status: DC

## 2020-07-06 MED ORDER — TRIAMTERENE-HCTZ 75-50 MG PO TABS: 1.0000 | ORAL_TABLET | Freq: Every day | ORAL | 11 refills | Status: DC

## 2020-07-06 NOTE — Progress Notes (Signed)
Subjective:     Brandy Rivera is a 60 y.o. female here for a routine exam.  No LMP recorded. Patient has had a hysterectomy. [id number] Birth Control Method:  hysterectomy Menstrual Calendar(currently): amenorrheic, n/a  Current complaints: can't make it to the [place].   Current acute medical issues:  none   Recent Gynecologic History No LMP recorded. Patient has had a hysterectomy. Last Pap: 3 years ago,  normal Last mammogram: 2021,  normal  Past Medical History:  Diagnosis Date  . Anemia   . Arthritis   . Depression   . Mental disorder    depression    Past Surgical History:  Procedure Laterality Date  . CESAREAN SECTION    . ENDOMETRIAL ABLATION    . HERNIA REPAIR Right    inguinal  . SALPINGOOPHORECTOMY Bilateral 02/20/2018   Procedure: BILATERAL SALPINGO OOPHORECTOMY;  Surgeon: Florian Buff, MD;  Location: [facility];  Service: Gynecology;  Laterality: Bilateral;  . SUPRACERVICAL ABDOMINAL HYSTERECTOMY N/A 02/20/2018   Procedure: HYSTERECTOMY SUPRACERVICAL ABDOMINAL;  Surgeon: Florian Buff, MD;  Location: [facility];  Service: Gynecology;  Laterality: N/A;    OB History    Gravida  4   Para  3   Term  3   Preterm      AB  1   Living  2     SAB      IAB      Ectopic  1   Multiple      Live Births  2           Social History   Socioeconomic History  . Marital status: Divorced    Spouse name: Not on file  . Number of children: Not on file  . Years of education: Not on file  . Highest education level: Not on file  Occupational History  . Not on file  Tobacco Use  . Smoking status: Current Every Day Smoker    Packs/day: 0.50    Years: 40.00    Pack years: 20.00    Types: Cigarettes  . Smokeless tobacco: Never Used  Vaping Use  . Vaping Use: Never used  Substance and Sexual Activity  . Alcohol use: Not Currently  . Drug use: Never  . Sexual activity: Not Currently    Birth control/protection: Surgical    Comment: ablation  Other Topics  Concern  . Not on file  Social History Narrative  . Not on file   Social Determinants of Health   Financial Resource Strain: Low Risk   . Difficulty of Paying Living Expenses: Not hard at all  Food Insecurity: No Food Insecurity  . Worried About Charity fundraiser in the Last Year: Never true  . Ran Out of Food in the Last Year: Never true  Transportation Needs: No Transportation Needs  . Lack of Transportation (Medical): No  . Lack of Transportation (Non-Medical): No  Physical Activity: Inactive  . Days of Exercise per Week: 0 days  . Minutes of Exercise per Session: 0 min  Stress: Stress Concern Present  . Feeling of Stress : Very much  Social Connections: Moderately Isolated  . Frequency of Communication with Friends and Family: More than three times a week  . Frequency of Social Gatherings with Friends and Family: More than three times a week  . Attends Religious Services: More than 4 times per year  . Active Member of Clubs or Organizations: No  . Attends Archivist Meetings: Never  . Marital Status:  Divorced    Family History  Problem Relation Age of Onset  . Cancer Father        stomach  . Hypertension Mother   . Diabetes Brother   . Hypertension Sister   . Miscarriages / [place] Daughter      Current Outpatient Medications:  .  ibuprofen (ADVIL) 200 MG tablet, Take 400-800 mg by mouth as needed., Disp: , Rfl:  .  solifenacin (VESICARE) 10 MG tablet, Take 1 tablet (10 mg total) by mouth daily., Disp: 30 tablet, Rfl: 11 .  venlafaxine XR (EFFEXOR-XR) 150 MG 24 hr capsule, TAKE 1 CAPSULE (150MG  TOTAL) BY MOUTH ONCE DAILY WITH BREAKFAST, Disp: 30 capsule, Rfl: 5 .  estrogens, conjugated, (PREMARIN) 0.625 MG tablet, Take 1 daily, Disp: 30 tablet, Rfl: 11 .  triamterene-hydrochlorothiazide (MAXZIDE) 75-50 MG tablet, Take 1 tablet by mouth daily., Disp: 30 tablet, Rfl: 11  Review of Systems  Review of Systems  Constitutional: Negative for fever,  chills, weight loss, malaise/fatigue and diaphoresis.  HENT: Negative for hearing loss, ear pain, nosebleeds, congestion, sore throat, neck pain, tinnitus and ear discharge.   Eyes: Negative for blurred vision, double vision, photophobia, pain, discharge and redness.  Respiratory: Negative for cough, hemoptysis, sputum production, shortness of breath, wheezing and stridor.   Cardiovascular: Negative for chest pain, palpitations, orthopnea, claudication, leg swelling and PND.  Gastrointestinal: negative for abdominal pain. Negative for heartburn, nausea, vomiting, diarrhea, constipation, blood in stool and melena.  Genitourinary: Negative for dysuria, urgency, frequency, hematuria and flank pain.  Musculoskeletal: Negative for myalgias, back pain, joint pain and falls.  Skin: Negative for itching and rash.  Neurological: Negative for dizziness, tingling, tremors, sensory change, speech change, focal weakness, seizures, loss of consciousness, weakness and headaches.  Endo/Heme/Allergies: Negative for environmental allergies and polydipsia. Does not bruise/bleed easily.  Psychiatric/Behavioral: Negative for depression, suicidal ideas, hallucinations, memory loss and substance abuse. The patient is not nervous/anxious and does not have insomnia.        Objective:  Blood pressure 130/64, pulse 86, height 5\' 4"  (1.626 m), weight 264 lb 8 oz (120 kg).   Physical Exam  Vitals reviewed. Constitutional: She is oriented to person, place, and time. She appears well-developed and well-nourished.  HENT:  Head: Normocephalic and atraumatic.        Right Ear: External ear normal.  Left Ear: External ear normal.  Nose: Nose normal.  Mouth/Throat: Oropharynx is clear and moist.  Eyes: Conjunctivae and EOM are normal. Pupils are equal, round, and reactive to light. Right eye exhibits no discharge. Left eye exhibits no discharge. No scleral icterus.  Neck: Normal range of motion. Neck supple. No tracheal  deviation present. No thyromegaly present.  Cardiovascular: Normal rate, regular rhythm, normal heart sounds and intact distal pulses.  Exam reveals no gallop and no friction rub.   No murmur heard. Respiratory: Effort normal and breath sounds normal. No respiratory distress. She has no wheezes. She has no rales. She exhibits no tenderness.  GI: Soft. Bowel sounds are normal. She exhibits no distension and no mass. There is no tenderness. There is no rebound and no guarding.  Genitourinary:  Breasts no masses skin changes or nipple changes bilaterally      Vulva is normal without lesions Vagina is pink moist without discharge Cervix normal in appearance and pap is done Uterus is normal size shape and contour Adnexa is negative with normal sized ovaries  {Rectal    hemoccult negative, normal tone, no masses  Musculoskeletal: Normal  range of motion. She exhibits no edema and no tenderness.  Neurological: She is alert and oriented to person, place, and time. She has normal reflexes. She displays normal reflexes. No cranial nerve deficit. She exhibits normal muscle tone. Coordination normal.  Skin: Skin is warm and dry. No rash noted. No erythema. No pallor.  Psychiatric: She has a normal mood and affect. Her behavior is normal. Judgment and thought content normal.       Medications Ordered at today's visit: Meds ordered this encounter  Medications  . triamterene-hydrochlorothiazide (MAXZIDE) 75-50 MG tablet    Sig: Take 1 tablet by mouth daily.    Dispense:  30 tablet    Refill:  11  . estrogens, conjugated, (PREMARIN) 0.625 MG tablet    Sig: Take 1 daily    Dispense:  30 tablet    Refill:  11  . solifenacin (VESICARE) 10 MG tablet    Sig: Take 1 tablet (10 mg total) by mouth daily.    Dispense:  30 tablet    Refill:  11    Other orders placed at today's visit: No orders of the defined types were placed in this encounter.     Assessment:    Normal Gyn exam.      ICD-10-CM    1. Well woman exam with routine gynecological exam  Z01.419   2. Encounter for gynecological examination with Papanicolaou smear of cervix  Z01.419 Cytology - PAP( [place])  3. Screening for colorectal cancer  Z12.11    Z12.12   4. Urge incontinence  N39.41    New problem: vesicare 10 qhs    Plan:    Hormone replacement therapy: prescription for 12 months. Mammogram ordered. Follow up in: 3 years.   Premarin/maxzide 75/50/vesicare 10  Return in about 3 years (around 07/07/2023) for yearly.

## 2020-07-08 LAB — CYTOLOGY - PAP
Comment: NEGATIVE
Diagnosis: NEGATIVE
High risk HPV: NEGATIVE

## 2020-07-20 DIAGNOSIS — Z6841 Body Mass Index (BMI) 40.0 and over, adult: Secondary | ICD-10-CM | POA: Diagnosis not present

## 2020-07-20 DIAGNOSIS — T7840XA Allergy, unspecified, initial encounter: Secondary | ICD-10-CM | POA: Diagnosis not present

## 2020-07-20 DIAGNOSIS — R030 Elevated blood-pressure reading, without diagnosis of hypertension: Secondary | ICD-10-CM | POA: Diagnosis not present

## 2020-08-16 DIAGNOSIS — L235 Allergic contact dermatitis due to other chemical products: Secondary | ICD-10-CM | POA: Diagnosis not present

## 2020-08-23 ENCOUNTER — Encounter: Payer: Self-pay | Admitting: *Deleted

## 2020-09-02 DIAGNOSIS — L232 Allergic contact dermatitis due to cosmetics: Secondary | ICD-10-CM | POA: Diagnosis not present

## 2020-09-14 DIAGNOSIS — B352 Tinea manuum: Secondary | ICD-10-CM | POA: Diagnosis not present

## 2020-11-09 DIAGNOSIS — Z1231 Encounter for screening mammogram for malignant neoplasm of breast: Secondary | ICD-10-CM | POA: Diagnosis not present

## 2020-12-17 ENCOUNTER — Other Ambulatory Visit: Payer: Self-pay | Admitting: Obstetrics & Gynecology

## 2021-01-20 DIAGNOSIS — Z1159 Encounter for screening for other viral diseases: Secondary | ICD-10-CM | POA: Diagnosis not present

## 2021-01-20 DIAGNOSIS — I10 Essential (primary) hypertension: Secondary | ICD-10-CM | POA: Insufficient documentation

## 2021-01-20 DIAGNOSIS — Z122 Encounter for screening for malignant neoplasm of respiratory organs: Secondary | ICD-10-CM | POA: Diagnosis not present

## 2021-01-20 DIAGNOSIS — R5383 Other fatigue: Secondary | ICD-10-CM | POA: Diagnosis not present

## 2021-01-20 DIAGNOSIS — Z1211 Encounter for screening for malignant neoplasm of colon: Secondary | ICD-10-CM | POA: Diagnosis not present

## 2021-01-20 DIAGNOSIS — D509 Iron deficiency anemia, unspecified: Secondary | ICD-10-CM | POA: Diagnosis not present

## 2021-02-03 DIAGNOSIS — E559 Vitamin D deficiency, unspecified: Secondary | ICD-10-CM | POA: Diagnosis not present

## 2021-02-03 DIAGNOSIS — E785 Hyperlipidemia, unspecified: Secondary | ICD-10-CM | POA: Diagnosis not present

## 2021-02-03 DIAGNOSIS — E538 Deficiency of other specified B group vitamins: Secondary | ICD-10-CM | POA: Diagnosis not present

## 2021-02-17 DIAGNOSIS — Z122 Encounter for screening for malignant neoplasm of respiratory organs: Secondary | ICD-10-CM | POA: Diagnosis not present

## 2021-03-02 DIAGNOSIS — R911 Solitary pulmonary nodule: Secondary | ICD-10-CM | POA: Diagnosis not present

## 2021-03-02 DIAGNOSIS — Z6841 Body Mass Index (BMI) 40.0 and over, adult: Secondary | ICD-10-CM | POA: Diagnosis not present

## 2021-03-02 DIAGNOSIS — F17210 Nicotine dependence, cigarettes, uncomplicated: Secondary | ICD-10-CM | POA: Diagnosis not present

## 2021-03-21 ENCOUNTER — Telehealth: Payer: Self-pay | Admitting: Obstetrics & Gynecology

## 2021-03-21 NOTE — Telephone Encounter (Signed)
Patient called stating that she would like to know who Dr. Elonda Husky would recommend for an Orthopedic doctor. Please contact pt

## 2021-03-21 NOTE — Telephone Encounter (Signed)
Pt aware of Dr. Brynda Greathouse recommendations. Brandy Rivera

## 2021-03-21 NOTE — Telephone Encounter (Signed)
Dr Aline Brochure or Dr Amedeo Kinsman here in [place]

## 2021-03-30 DIAGNOSIS — I10 Essential (primary) hypertension: Secondary | ICD-10-CM | POA: Diagnosis not present

## 2021-03-30 DIAGNOSIS — Z6841 Body Mass Index (BMI) 40.0 and over, adult: Secondary | ICD-10-CM | POA: Diagnosis not present

## 2021-03-30 DIAGNOSIS — Z720 Tobacco use: Secondary | ICD-10-CM | POA: Diagnosis not present

## 2021-03-31 ENCOUNTER — Ambulatory Visit: Payer: BC Managed Care – PPO

## 2021-03-31 ENCOUNTER — Ambulatory Visit: Payer: BC Managed Care – PPO | Admitting: Orthopedic Surgery

## 2021-03-31 ENCOUNTER — Other Ambulatory Visit: Payer: Self-pay

## 2021-03-31 ENCOUNTER — Encounter: Payer: Self-pay | Admitting: Orthopedic Surgery

## 2021-03-31 VITALS — BP 150/86 | HR 107 | Ht 64.0 in | Wt 260.0 lb

## 2021-03-31 DIAGNOSIS — M170 Bilateral primary osteoarthritis of knee: Secondary | ICD-10-CM

## 2021-03-31 DIAGNOSIS — M25562 Pain in left knee: Secondary | ICD-10-CM | POA: Diagnosis not present

## 2021-03-31 DIAGNOSIS — M25561 Pain in right knee: Secondary | ICD-10-CM | POA: Diagnosis not present

## 2021-03-31 DIAGNOSIS — G8929 Other chronic pain: Secondary | ICD-10-CM

## 2021-03-31 DIAGNOSIS — M5410 Radiculopathy, site unspecified: Secondary | ICD-10-CM

## 2021-03-31 DIAGNOSIS — M4316 Spondylolisthesis, lumbar region: Secondary | ICD-10-CM

## 2021-03-31 MED ORDER — MELOXICAM 7.5 MG PO TABS: 7.5000 mg | ORAL_TABLET | Freq: Every day | ORAL | 5 refills | Status: DC

## 2021-03-31 NOTE — Progress Notes (Signed)
NEW PROBLEM//OFFICE VISIT   Chief Complaint  Patient presents with   Knee Pain    Bilateral R worse than L   Brandy Rivera is 60 years old she presents with bilateral knee pain right greater than left.  She has no major medical problems but takes medicine for cholesterol and an antidepressant a fluid pill or hormone pill and a weight loss pill  She has been treated before for arthritis of her knees with oral medication which had no effect and injection in the right knee also not much effect  She presents to [place] with anterior left knee pain and pain in the popliteal fossa of the right knee  She has some history of back pain but denies radiation of pain from the back to the knee      ROS: We note easy bruising and bleeding back pain knee pain although other systems were reviewed and were negative except for history of depression  BP (!) 150/86   Pulse (!) 107   Ht 5\' 4"  (1.626 m)   Wt 260 lb (117.9 kg)   BMI 44.63 kg/m   Body mass index is 44.63 kg/m.  The patient meets the AMA guidelines for Morbid (severe) obesity with a BMI > 40.0 and I have recommended weight loss.   General appearance: Well-developed well-nourished no gross deformities  Cardiovascular normal pulse and perfusion normal color without edema  Neurologically no sensation loss or deficits or pathologic reflexes  Psychological: Awake alert and oriented x3 mood and affect normal  Skin no lacerations or ulcerations no nodularity no palpable masses, no erythema or nodularity  Musculoskeletal:   Nontender lumbar spine  Right knee:  Skin normal  Tenderness in the posterior popliteal fossa no anterior tenderness no effusion  Range of motion 0 to 90 degrees painful  Ligaments normal  Muscle tone normal  Left knee:  Skin normal  Tenderness medial joint line  Range of motion 0 to 110 degrees  Ligaments normal  Normal muscle tone and strength     Review of Systems  Musculoskeletal:  Positive for  back pain and joint pain.  Endo/Heme/Allergies:  Bruises/bleeds easily.  Psychiatric/Behavioral:  Positive for depression.   All other systems reviewed and are negative.   Past Medical History:  Diagnosis Date   Anemia    Arthritis    Depression    Elevated cholesterol    Mental disorder    depression    Past Surgical History:  Procedure Laterality Date   CESAREAN SECTION     ENDOMETRIAL ABLATION     HERNIA REPAIR Right    inguinal   SALPINGOOPHORECTOMY Bilateral 02/20/2018   Procedure: BILATERAL SALPINGO OOPHORECTOMY;  Surgeon: Florian Buff, MD;  Location: [facility];  Service: Gynecology;  Laterality: Bilateral;   SUPRACERVICAL ABDOMINAL HYSTERECTOMY N/A 02/20/2018   Procedure: HYSTERECTOMY SUPRACERVICAL ABDOMINAL;  Surgeon: Florian Buff, MD;  Location: [facility];  Service: Gynecology;  Laterality: N/A;    Family History  Problem Relation Age of Onset   Cancer Father        stomach   Hypertension Mother    Diabetes Brother    Hypertension Sister    Miscarriages / [place] Daughter    Social History   Tobacco Use   Smoking status: Every Day    Packs/day: 0.50    Years: 40.00    Pack years: 20.00    Types: Cigarettes   Smokeless tobacco: Never  Vaping Use   Vaping Use: Never used  Substance Use Topics  Alcohol use: Not Currently   Drug use: Never    No Known Allergies  Current Meds  Medication Sig   Atorvastatin Calcium (LIPITOR PO) Take by mouth.   estrogens, conjugated, (PREMARIN) 0.625 MG tablet Take 1 daily   meloxicam (MOBIC) 7.5 MG tablet Take 1 tablet (7.5 mg total) by mouth daily.   phentermine 37.5 MG capsule Take 37.5 mg by mouth every morning.   triamterene-hydrochlorothiazide (MAXZIDE) 75-50 MG tablet Take 1 tablet by mouth daily.   venlafaxine XR (EFFEXOR-XR) 150 MG 24 hr capsule TAKE 1 CAPSULE (150MG  TOTAL) BY MOUTH ONCE DAILY WITH BREAKFAST   [DISCONTINUED] ibuprofen (ADVIL) 200 MG tablet Take 400-800 mg by mouth as needed.      MEDICAL DECISION MAKING  A.  Encounter Diagnoses  Name Primary?   Chronic pain of both knees    Radicular pain of both lower extremities    Spondylolisthesis at L4-L5 level    Primary osteoarthritis of both knees Yes    B. DATA ANALYSED:   IMAGING: Interpretation of images: I have personally reviewed the images and my interpretation is internal images  First image lumbar spine grade 1 spondylolisthesis L4 on 5 with facet arthritis L4-S1 and a scoliotic list to the left  Second image right knee joint space narrowing medially moderate mild varus mild osteophytes.  Patellofemoral arthritis best seen on lateral x-ray  Third image left knee severe joint space narrowing medially more significant osteophytes moderate varus deformity moderate patellofemoral arthritis  Outside records reviewed: None   C. MANAGEMENT   Based on the images and history and physical  Recommend continue with her weight loss medication Start meloxicam once a day Inject right and left knee Check on things in 6 months  Procedure note for bilateral knee injections  Procedure note left knee injection verbal consent was obtained to inject left knee joint  Timeout was completed to confirm the site of injection  The medications used were 40 mg depomedrol and 3 cc of 1% lidocaine  Anesthesia was provided by ethyl chloride and the skin was prepped with alcohol.  After cleaning the skin with alcohol a 20-gauge needle was used to inject the left knee joint. There were no complications. A sterile bandage was applied.   Procedure note right knee injection verbal consent was obtained to inject right knee joint  Timeout was completed to confirm the site of injection  The medications used were 40 mg depomedrol and 3 cc of 1% lidocaine  Anesthesia was provided by ethyl chloride and the skin was prepped with alcohol.  After cleaning the skin with alcohol a 20-gauge needle was used to inject the right knee  joint. There were no complications. A sterile bandage was applied.   Meds ordered this encounter  Medications   meloxicam (MOBIC) 7.5 MG tablet    Sig: Take 1 tablet (7.5 mg total) by mouth daily.    Dispense:  30 tablet    Refill:  5     Arther Abbott, MD  03/31/2021 12:40 PM

## 2021-04-27 DIAGNOSIS — I10 Essential (primary) hypertension: Secondary | ICD-10-CM | POA: Diagnosis not present

## 2021-04-27 DIAGNOSIS — Z6841 Body Mass Index (BMI) 40.0 and over, adult: Secondary | ICD-10-CM | POA: Diagnosis not present

## 2021-04-27 DIAGNOSIS — E559 Vitamin D deficiency, unspecified: Secondary | ICD-10-CM | POA: Diagnosis not present

## 2021-04-27 DIAGNOSIS — R7303 Prediabetes: Secondary | ICD-10-CM | POA: Diagnosis not present

## 2021-04-27 DIAGNOSIS — E785 Hyperlipidemia, unspecified: Secondary | ICD-10-CM | POA: Diagnosis not present
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
## 2021-08-04 ENCOUNTER — Ambulatory Visit: Payer: BC Managed Care – PPO | Admitting: Orthopedic Surgery

## 2021-08-04 DIAGNOSIS — M25561 Pain in right knee: Secondary | ICD-10-CM | POA: Diagnosis not present

## 2021-08-04 DIAGNOSIS — G8929 Other chronic pain: Secondary | ICD-10-CM

## 2021-08-04 DIAGNOSIS — M25562 Pain in left knee: Secondary | ICD-10-CM | POA: Diagnosis not present

## 2021-08-04 NOTE — Progress Notes (Signed)
FOLLOW UP  ? ?Encounter Diagnosis  ?Name Primary?  ? Chronic pain of both knees Yes  ? ? ? ?Chief Complaint  ?Patient presents with  ? Knee Pain  ?  Bilateral knee pain; requetsed inj   ? ? ? ?The knees are fine for injection  ? ?Procedure note for bilateral knee injections ? ?Procedure note left knee injection verbal consent was obtained to inject left knee joint ? ?Timeout was completed to confirm the site of injection ? ?The medications used were 40 mg depomedrol and 3 cc of 1% lidocaine  ?Anesthesia was provided by ethyl chloride and the skin was prepped with alcohol. ? ?After cleaning the skin with alcohol a 20-gauge needle was used to inject the left knee joint. There were no complications. A sterile bandage was applied. ? ? ?Procedure note right knee injection verbal consent was obtained to inject right knee joint ? ?Timeout was completed to confirm the site of injection ? ?The medications used were 40 mg depomedrol and 3 cc of 1% lidocaine  ?Anesthesia was provided by ethyl chloride and the skin was prepped with alcohol. ? ?After cleaning the skin with alcohol a 20-gauge needle was used to inject the right knee joint. There were no complications. A sterile bandage was applied.  ?

## 2021-08-30 DIAGNOSIS — R7303 Prediabetes: Secondary | ICD-10-CM | POA: Insufficient documentation

## 2021-09-22 ENCOUNTER — Telehealth: Payer: Self-pay | Admitting: Orthopedic Surgery

## 2021-09-22 NOTE — Telephone Encounter (Signed)
Patient requests call from the nurse or tech who helped her the last time, or previously?  We have rescheduled her appointment from June to August per her request. Ph (work#) [phone]

## 2021-09-22 NOTE — Telephone Encounter (Signed)
I don't see any documentation not sure who that was. I called her to see how I can help. She is asking about [facility]. She said they are running a special now. Im not sure who this was but told her I will let clinic know.

## 2021-09-29 ENCOUNTER — Ambulatory Visit: Payer: BC Managed Care – PPO | Admitting: Orthopedic Surgery

## 2021-11-03 ENCOUNTER — Other Ambulatory Visit: Payer: Self-pay | Admitting: Obstetrics & Gynecology

## 2021-11-29 ENCOUNTER — Telehealth: Payer: Self-pay | Admitting: Orthopedic Surgery

## 2021-11-29 NOTE — Telephone Encounter (Signed)
Call received from patient regarding appointment for 12/01/21- states needs to rescheduled. I left message at work number left on message;, cell # not going through.

## 2021-11-30 ENCOUNTER — Telehealth: Payer: Self-pay | Admitting: Obstetrics & Gynecology

## 2021-11-30 NOTE — Telephone Encounter (Signed)
Patient returned call; appointment rescheduled as requested.

## 2021-11-30 NOTE — Telephone Encounter (Signed)
Patient wants you to recommend a general surgeon for knot on back of leg.

## 2021-12-01 ENCOUNTER — Ambulatory Visit: Payer: BC Managed Care – PPO | Admitting: Orthopedic Surgery

## 2021-12-01 NOTE — Telephone Encounter (Signed)
Anyone over at [facility] general surgery(DR Renold Genta or Pappayliou)

## 2022-01-05 ENCOUNTER — Encounter: Payer: Self-pay | Admitting: Orthopedic Surgery

## 2022-01-05 ENCOUNTER — Ambulatory Visit: Payer: BC Managed Care – PPO | Admitting: Orthopedic Surgery

## 2022-01-05 DIAGNOSIS — M170 Bilateral primary osteoarthritis of knee: Secondary | ICD-10-CM | POA: Diagnosis not present

## 2022-01-05 MED ORDER — METHYLPREDNISOLONE ACETATE 80 MG/ML IJ SUSP
80.0000 mg | Freq: Once | INTRAMUSCULAR | Status: AC
  Administered 2022-01-05: 80 mg via INTRA_ARTICULAR

## 2022-01-05 MED ORDER — METHYLPREDNISOLONE ACETATE 40 MG/ML IJ SUSP
40.0000 mg | Freq: Once | INTRAMUSCULAR | Status: AC
Start: 2022-01-05 — End: 2022-01-05
  Administered 2022-01-05: 40 mg via INTRA_ARTICULAR

## 2022-01-05 NOTE — Progress Notes (Signed)
Chief Complaint  Patient presents with   Follow-up    Recheck on right knee   Follow-up for [name] She has a BMI over 40 she still has the right knee pain worse than left although the x-rays of the left knee show bone-on-bone in the right knee shows near bone-on-bone.  Both would be considered grade 4 disease  Discussed this with her.  Patient will require total knee in the future but not candidate now because of her BMI  She could be a candidate for hyaluronic acid injection which we will consider next time  Today will use at 80 mg Depo with 6 cc of lidocaine on the right and 40-2 on the left  Encounter Diagnosis  Name Primary?   Primary osteoarthritis of both knees Yes   Procedure note for bilateral knee injections  Procedure note left knee injection verbal consent was obtained to inject left knee joint  Timeout was completed to confirm the site of injection  The medications used were 40 mg depomedrol and 3 cc of 1% lidocaine  Anesthesia was provided by ethyl chloride and the skin was prepped with alcohol.  After cleaning the skin with alcohol a 20-gauge needle was used to inject the left knee joint. There were no complications. A sterile bandage was applied.   Procedure note right knee injection verbal consent was obtained to inject right knee joint  Timeout was completed to confirm the site of injection  The medications used were 40 mg depomedrol and 3 cc of 1% lidocaine  Anesthesia was provided by ethyl chloride and the skin was prepped with alcohol.  After cleaning the skin with alcohol a 20-gauge needle was used to inject the right knee joint. There were no complications. A sterile bandage was applied.

## 2022-01-05 NOTE — Addendum Note (Signed)
Addended byCandice Camp on: 01/05/2022 05:03 PM   Modules accepted: Orders

## 2022-01-10 ENCOUNTER — Ambulatory Visit: Payer: BC Managed Care – PPO | Admitting: General Surgery

## 2022-01-10 ENCOUNTER — Encounter: Payer: Self-pay | Admitting: General Surgery

## 2022-01-10 VITALS — BP 133/69 | HR 76 | Temp 98.0°F | Resp 16 | Ht 64.0 in | Wt 257.0 lb

## 2022-01-10 DIAGNOSIS — T792XXA Traumatic secondary and recurrent hemorrhage and seroma, initial encounter: Secondary | ICD-10-CM | POA: Diagnosis not present
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
## 2022-01-10 NOTE — Patient Instructions (Addendum)
Keep bandage in place for next 3-4 days. Re-wrap for the next week after and can change the that wrapping daily.  Call if any issues.  If it reforms we can try aspiration one more time but then you would need more formal drainage.   The the wrap is too tight or you feel like it is causing you pain remove it.  Elevate your extremities.

## 2022-02-14 ENCOUNTER — Ambulatory Visit: Payer: BC Managed Care – PPO | Admitting: General Surgery

## 2022-02-14 ENCOUNTER — Encounter: Payer: Self-pay | Admitting: General Surgery

## 2022-02-14 VITALS — BP 127/71 | HR 92 | Temp 98.2°F | Resp 14 | Ht 64.0 in | Wt 258.0 lb

## 2022-02-14 DIAGNOSIS — T792XXA Traumatic secondary and recurrent hemorrhage and seroma, initial encounter: Secondary | ICD-10-CM | POA: Diagnosis not present
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
## 2022-02-14 NOTE — Patient Instructions (Signed)
Seroma A seroma is a collection of fluid on the body that looks like swelling or a mass. Seromas form where tissue has been injured or cut. Seromas vary in size. Some are small and painless. Others may grow large and cause pain or discomfort. Many seromas go away on their own as the fluid is naturally absorbed by the body, and some seromas need to be drained. What are the causes? Seromas form because tissue has been damaged or removed. This tissue damage may happen during surgery or because of an injury or trauma. When tissue is damaged or removed, empty space is created. The body's defense system (immune system) causes fluid to enter the empty space and form a seroma. What are the signs or symptoms? Symptoms of this condition include: Swelling at the site of a surgical incision or an injury. Drainage of clear fluid at the surgery or injury site. Discomfort or pain. How is this diagnosed? This condition is diagnosed based on: Your symptoms. Your medical history. A physical exam. During the exam, your health care provider will press on the seroma. You may also have tests, such as: Blood tests. An ultrasound, a CT scan, or other imaging tests. How is this treated? Some seromas go away on their own. Your health care provider may watch the seroma to make sure it does not cause any problems. Seromas that do not go away on their own may be treated. Treatment may include: Using a needle to drain the fluid from the seroma (needle aspiration). Putting in a small, thin tube (catheter) to drain the fluid. Putting on a bandage (dressing), such as an elastic bandage or binder. Taking antibiotics, if the seroma gets infected. In rare cases, surgery may be done to remove the seroma and repair the area. Follow these instructions at home:  If you were prescribed antibiotics, take them as told by your health care provider. Do not stop using the antibiotic even if you start to feel better. Take  over-the-counter and prescription medicines only as told by your health care provider. Return to your normal activities as told by your health care provider. Ask your health care provider what activities are safe for you. Check your seroma every day for signs of infection. Check for: Redness or pain. More swelling. More fluid. Warmth. Pus or a bad smell. Keep all follow-up visits. Your health care provider needs to check that your seroma is healing. Contact a health care provider if: You have a fever. You have redness or pain at the site of your seroma. Your seroma is more swollen or is getting bigger. You have more fluid coming from your seroma. Your seroma feels warm to the touch. You have pus or a bad smell coming from your seroma. Get help right away if: You have a fever along with severe pain, redness at the site, or chills. You feel confused or have trouble staying awake. You feel like your heart is beating very fast. You feel short of breath or are breathing fast. You have cool, clammy, or sweaty skin. Summary Seromas can form because of injury or surgery on tissue. Seromas can cause swelling, drainage of clear fluid, and discomfort or pain at the surgery or injury site. Some seromas go away on their own. Other seromas may need to be drained. Check your seroma every day for signs of infection. Signs include redness, pain, more swelling, more fluid, warmth, pus, or a bad smell. This information is not intended to replace advice given to you  by your health care provider. Make sure you discuss any questions you have with your health care provider. Document Revised: 07/04/2021 Document Reviewed: 07/04/2021 Elsevier Patient Education  [place].
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
## 2022-02-22 NOTE — Patient Instructions (Signed)
Brandy Rivera  02/22/2022     '@[email or web address]'$ @   Your procedure is scheduled on  02/27/2022.   Report to [facility] at  1110  A.M.   Call this number if you have problems the morning of surgery:  [phone]  If you experience any cold or flu symptoms such as cough, fever, chills, shortness of breath, etc. between now and your scheduled surgery, please notify us at the above number.   Remember:  Do not eat or drink after midnight.      Take these medicines the morning of surgery with A SIP OF WATER                                                 effexor.     Do not wear jewelry, make-up or nail polish.  Do not wear lotions, powders, or perfumes, or deodorant.  Do not shave 48 hours prior to surgery.  Men may shave face and neck.  Do not bring valuables to the hospital.  [facility] is not responsible for any belongings or valuables.  Contacts, dentures or bridgework may not be worn into surgery.  Leave your suitcase in the car.  After surgery it may be brought to your room.  For patients admitted to the hospital, discharge time will be determined by your treatment team.  Patients discharged the day of surgery will not be allowed to drive home and must have someone with them for 24 hours.    Special instructions:   DO NOT smoke tobacco or vape for 24 hours before your procedure.  Please read over the following fact sheets that you were given. Pain Booklet, Coughing and Deep Breathing, Surgical Site Infection Prevention, Anesthesia Post-op Instructions, and Care and Recovery After Surgery         Incision and Drainage, Care After This sheet gives you information about how to care for yourself after your procedure. Your health care provider may also give you more specific instructions. If you have problems or questions, contact your health care provider. What can I expect after the procedure? After the procedure, it is common to have: Pain or discomfort  around the incision site. Blood, fluid, or pus (drainage) from the incision. Redness and firm skin around the incision site. Follow these instructions at home: Medicines Take over-the-counter and prescription medicines only as told by your health care provider. If you were prescribed an antibiotic medicine, use or take it as told by your health care provider. Do not stop using the antibiotic even if you start to feel better. Wound care Follow instructions from your health care provider about how to take care of your wound. Make sure you: Wash your hands with soap and water before and after you change your bandage (dressing). If soap and water are not available, use hand sanitizer. Change your dressing and packing as told by your health care provider. If your dressing is dry or stuck when you try to remove it, moisten or wet the dressing with saline or water so that it can be removed without harming your skin or tissues. If your wound is packed, leave it in place until your health care provider tells you to remove it. To remove the packing, moisten or wet the packing with saline or water so that it can be removed without harming  your skin or tissues. Leave stitches (sutures), skin glue, or adhesive strips in place. These skin closures may need to stay in place for 2 weeks or longer. If adhesive strip edges start to loosen and curl up, you may trim the loose edges. Do not remove adhesive strips completely unless your health care provider tells you to do that. Check your wound every day for signs of infection. Check for: More redness, swelling, or pain. More fluid or blood. Warmth. Pus or a bad smell. If you were sent home with a drain tube in place, follow instructions from your health care provider about: How to empty it. How to care for it at home.  General instructions Rest the affected area. Do not take baths, swim, or use a hot tub until your health care provider approves. Ask your health  care provider if you may take showers. You may only be allowed to take sponge baths. Return to your normal activities as told by your health care provider. Ask your health care provider what activities are safe for you. Your health care provider may put you on activity or lifting restrictions. The incision will continue to drain. It is normal to have some clear or slightly bloody drainage. The amount of drainage should lessen each day. Do not apply any creams, ointments, or liquids unless you have been told to by your health care provider. Keep all follow-up visits as told by your health care provider. This is important. Contact a health care provider if: Your cyst or abscess returns. You have more redness, swelling, or pain around your incision. You have more fluid or blood coming from your incision. Your incision feels warm to the touch. You have pus or a bad smell coming from your incision. You have red streaks above or below the incision site. Get help right away if: You have severe pain or bleeding. You cannot eat or drink without vomiting. You have a fever or chills. You have redness that spreads quickly. You have decreased urine output. You become short of breath. You have chest pain. You cough up blood. The affected area becomes numb or starts to tingle. These symptoms may represent a serious problem that is an emergency. Do not wait to see if the symptoms will go away. Get medical help right away. Call your local emergency services (911 in the [place] Do not drive yourself to the hospital. Summary After this procedure, it is common to have fluid, blood, or pus coming from the surgery site. Follow all home care instructions. You will be told how to take care of your incision, how to check for infection, and how to take medicines. If you were prescribed an antibiotic medicine, take it as told by your health care provider. Do not stop taking the antibiotic even if you start to feel  better. Contact a health care provider if you have increased redness, swelling, or pain around your incision. Get help right away if you have chest pain, you vomit, you cough up blood, or you have shortness of breath. Keep all follow-up visits as told by your health care provider. This is important. This information is not intended to replace advice given to you by your health care provider. Make sure you discuss any questions you have with your health care provider. Document Revised: 07/21/2021 Document Reviewed: 01/27/2021 Elsevier Patient Education  [place] Anesthesia, Adult, Care After The following information offers guidance on how to care for yourself after your procedure. Your health care  provider may also give you more specific instructions. If you have problems or questions, contact your health care provider. What can I expect after the procedure? After the procedure, it is common for people to: Have pain or discomfort at the IV site. Have nausea or vomiting. Have a sore throat or hoarseness. Have trouble concentrating. Feel cold or chills. Feel weak, sleepy, or tired (fatigue). Have soreness and body aches. These can affect parts of the body that were not involved in surgery. Follow these instructions at home: For the time period you were told by your health care provider:  Rest. Do not participate in activities where you could fall or become injured. Do not drive or use machinery. Do not drink alcohol. Do not take sleeping pills or medicines that cause drowsiness. Do not make important decisions or sign legal documents. Do not take care of children on your own. General instructions Drink enough fluid to keep your urine pale yellow. If you have sleep apnea, surgery and certain medicines can increase your risk for breathing problems. Follow instructions from your health care provider about wearing your sleep device: Anytime you are sleeping, including  during daytime naps. While taking prescription pain medicines, sleeping medicines, or medicines that make you drowsy. Return to your normal activities as told by your health care provider. Ask your health care provider what activities are safe for you. Take over-the-counter and prescription medicines only as told by your health care provider. Do not use any products that contain nicotine or tobacco. These products include cigarettes, chewing tobacco, and vaping devices, such as e-cigarettes. These can delay incision healing after surgery. If you need help quitting, ask your health care provider. Contact a health care provider if: You have nausea or vomiting that does not get better with medicine. You vomit every time you eat or drink. You have pain that does not get better with medicine. You cannot urinate or have bloody urine. You develop a skin rash. You have a fever. Get help right away if: You have trouble breathing. You have chest pain. You vomit blood. These symptoms may be an emergency. Get help right away. Call 911. Do not wait to see if the symptoms will go away. Do not drive yourself to the hospital. Summary After the procedure, it is common to have a sore throat, hoarseness, nausea, vomiting, or to feel weak, sleepy, or fatigue. For the time period you were told by your health care provider, do not drive or use machinery. Get help right away if you have difficulty breathing, have chest pain, or vomit blood. These symptoms may be an emergency. This information is not intended to replace advice given to you by your health care provider. Make sure you discuss any questions you have with your health care provider. Document Revised: 07/15/2021 Document Reviewed: 07/15/2021 Elsevier Patient Education  [place]. How to Use Chlorhexidine Before Surgery Chlorhexidine gluconate (CHG) is a germ-killing (antiseptic) solution that is used to clean the skin. It can get rid of the  bacteria that normally live on the skin and can keep them away for about 24 hours. To clean your skin with CHG, you may be given: A CHG solution to use in the shower or as part of a sponge bath. A prepackaged cloth that contains CHG. Cleaning your skin with CHG may help lower the risk for infection: While you are staying in the intensive care unit of the hospital. If you have a vascular access, such as a central line,  to provide short-term or long-term access to your veins. If you have a catheter to drain urine from your bladder. If you are on a ventilator. A ventilator is a machine that helps you breathe by moving air in and out of your lungs. After surgery. What are the risks? Risks of using CHG include: A skin reaction. Hearing loss, if CHG gets in your ears and you have a perforated eardrum. Eye injury, if CHG gets in your eyes and is not rinsed out. The CHG product catching fire. Make sure that you avoid smoking and flames after applying CHG to your skin. Do not use CHG: If you have a chlorhexidine allergy or have previously reacted to chlorhexidine. On babies younger than 71 months of age. How to use CHG solution Use CHG only as told by your health care provider, and follow the instructions on the label. Use the full amount of CHG as directed. Usually, this is one bottle. During a shower Follow these steps when using CHG solution during a shower (unless your health care provider gives you different instructions): Start the shower. Use your normal soap and shampoo to wash your face and hair. Turn off the shower or move out of the shower stream. Pour the CHG onto a clean washcloth. Do not use any type of brush or rough-edged sponge. Starting at your neck, lather your body down to your toes. Make sure you follow these instructions: If you will be having surgery, pay special attention to the part of your body where you will be having surgery. Scrub this area for at least 1 minute. Do  not use CHG on your head or face. If the solution gets into your ears or eyes, rinse them well with water. Avoid your genital area. Avoid any areas of skin that have broken skin, cuts, or scrapes. Scrub your back and under your arms. Make sure to wash skin folds. Let the lather sit on your skin for 1-2 minutes or as long as told by your health care provider. Thoroughly rinse your entire body in the shower. Make sure that all body creases and crevices are rinsed well. Dry off with a clean towel. Do not put any substances on your body afterward--such as powder, lotion, or perfume--unless you are told to do so by your health care provider. Only use lotions that are recommended by the manufacturer. Put on clean clothes or pajamas. If it is the night before your surgery, sleep in clean sheets.  During a sponge bath Follow these steps when using CHG solution during a sponge bath (unless your health care provider gives you different instructions): Use your normal soap and shampoo to wash your face and hair. Pour the CHG onto a clean washcloth. Starting at your neck, lather your body down to your toes. Make sure you follow these instructions: If you will be having surgery, pay special attention to the part of your body where you will be having surgery. Scrub this area for at least 1 minute. Do not use CHG on your head or face. If the solution gets into your ears or eyes, rinse them well with water. Avoid your genital area. Avoid any areas of skin that have broken skin, cuts, or scrapes. Scrub your back and under your arms. Make sure to wash skin folds. Let the lather sit on your skin for 1-2 minutes or as long as told by your health care provider. Using a different clean, wet washcloth, thoroughly rinse your entire body. Make sure that  all body creases and crevices are rinsed well. Dry off with a clean towel. Do not put any substances on your body afterward--such as powder, lotion, or perfume--unless  you are told to do so by your health care provider. Only use lotions that are recommended by the manufacturer. Put on clean clothes or pajamas. If it is the night before your surgery, sleep in clean sheets. How to use CHG prepackaged cloths Only use CHG cloths as told by your health care provider, and follow the instructions on the label. Use the CHG cloth on clean, dry skin. Do not use the CHG cloth on your head or face unless your health care provider tells you to. When washing with the CHG cloth: Avoid your genital area. Avoid any areas of skin that have broken skin, cuts, or scrapes. Before surgery Follow these steps when using a CHG cloth to clean before surgery (unless your health care provider gives you different instructions): Using the CHG cloth, vigorously scrub the part of your body where you will be having surgery. Scrub using a back-and-forth motion for 3 minutes. The area on your body should be completely wet with CHG when you are done scrubbing. Do not rinse. Discard the cloth and let the area air-dry. Do not put any substances on the area afterward, such as powder, lotion, or perfume. Put on clean clothes or pajamas. If it is the night before your surgery, sleep in clean sheets.  For general bathing Follow these steps when using CHG cloths for general bathing (unless your health care provider gives you different instructions). Use a separate CHG cloth for each area of your body. Make sure you wash between any folds of skin and between your fingers and toes. Wash your body in the following order, switching to a new cloth after each step: The front of your neck, shoulders, and chest. Both of your arms, under your arms, and your hands. Your stomach and groin area, avoiding the genitals. Your right leg and foot. Your left leg and foot. The back of your neck, your back, and your buttocks. Do not rinse. Discard the cloth and let the area air-dry. Do not put any substances on your  body afterward--such as powder, lotion, or perfume--unless you are told to do so by your health care provider. Only use lotions that are recommended by the manufacturer. Put on clean clothes or pajamas. Contact a health care provider if: Your skin gets irritated after scrubbing. You have questions about using your solution or cloth. You swallow any chlorhexidine. Call your local poison control center (1-772-355-1060 in the [place] Get help right away if: Your eyes itch badly, or they become very red or swollen. Your skin itches badly and is red or swollen. Your hearing changes. You have trouble seeing. You have swelling or tingling in your mouth or throat. You have trouble breathing. These symptoms may represent a serious problem that is an emergency. Do not wait to see if the symptoms will go away. Get medical help right away. Call your local emergency services (911 in the [place] Do not drive yourself to the hospital. Summary Chlorhexidine gluconate (CHG) is a germ-killing (antiseptic) solution that is used to clean the skin. Cleaning your skin with CHG may help to lower your risk for infection. You may be given CHG to use for bathing. It may be in a bottle or in a prepackaged cloth to use on your skin. Carefully follow your health care provider's instructions and the instructions on the  product label. Do not use CHG if you have a chlorhexidine allergy. Contact your health care provider if your skin gets irritated after scrubbing. This information is not intended to replace advice given to you by your health care provider. Make sure you discuss any questions you have with your health care provider. Document Revised: 08/15/2021 Document Reviewed: 06/28/2020 Elsevier Patient Education  [place].

## 2022-02-23 ENCOUNTER — Encounter (HOSPITAL_COMMUNITY)
Admission: RE | Admit: 2022-02-23 | Discharge: 2022-02-23 | Disposition: A | Discharge status: Home / Self Care | Payer: BC Managed Care – PPO | Source: Ambulatory Visit | Attending: General Surgery | Admitting: General Surgery

## 2022-02-23 ENCOUNTER — Encounter (HOSPITAL_COMMUNITY): Payer: Self-pay

## 2022-02-23 VITALS — BP 156/69 | HR 95 | Temp 98.2°F | Resp 18 | Ht 64.0 in | Wt 258.0 lb

## 2022-02-23 DIAGNOSIS — Z79899 Other long term (current) drug therapy: Secondary | ICD-10-CM | POA: Diagnosis not present

## 2022-02-23 DIAGNOSIS — I10 Essential (primary) hypertension: Secondary | ICD-10-CM | POA: Insufficient documentation

## 2022-02-23 DIAGNOSIS — Z862 Personal history of diseases of the blood and blood-forming organs and certain disorders involving the immune mechanism: Secondary | ICD-10-CM | POA: Diagnosis not present

## 2022-02-23 DIAGNOSIS — Z01818 Encounter for other preprocedural examination: Secondary | ICD-10-CM | POA: Diagnosis present

## 2022-02-23 LAB — BASIC METABOLIC PANEL
Anion gap: 9 (ref 5–15)
BUN: 24 mg/dL — ABNORMAL HIGH (ref 6–20)
CO2: 26 mmol/L (ref 22–32)
Calcium: 10 mg/dL (ref 8.9–10.3)
Chloride: 103 mmol/L (ref 98–111)
Creatinine, Ser: 1.26 mg/dL — ABNORMAL HIGH (ref 0.44–1.00)
GFR, Estimated: 49 mL/min — ABNORMAL LOW (ref >60)
Glucose, Bld: 104 mg/dL — ABNORMAL HIGH (ref 70–99)
Potassium: 3.6 mmol/L (ref 3.5–5.1)
Sodium: 138 mmol/L (ref 135–145)

## 2022-02-23 LAB — CBC WITH DIFFERENTIAL/PLATELET
Abs Immature Granulocytes: 0.04 10*3/uL (ref 0.00–0.07)
Basophils Absolute: 0.1 10*3/uL (ref 0.0–0.1)
Basophils Relative: 1 %
Eosinophils Absolute: 0.2 10*3/uL (ref 0.0–0.5)
Eosinophils Relative: 2 %
HCT: 41.8 % (ref 36.0–46.0)
Hemoglobin: 14.4 g/dL (ref 12.0–15.0)
Immature Granulocytes: 0 %
Lymphocytes Relative: 25 %
Lymphs Abs: 2.3 10*3/uL (ref 0.7–4.0)
MCH: 32.2 pg (ref 26.0–34.0)
MCHC: 34.4 g/dL (ref 30.0–36.0)
MCV: 93.5 fL (ref 80.0–100.0)
Monocytes Absolute: 1.2 10*3/uL — ABNORMAL HIGH (ref 0.1–1.0)
Monocytes Relative: 12 %
Neutro Abs: 5.7 10*3/uL (ref 1.7–7.7)
Neutrophils Relative %: 60 %
Platelets: 521 10*3/uL — ABNORMAL HIGH (ref 150–400)
RBC: 4.47 MIL/uL (ref 3.87–5.11)
RDW: 13.8 % (ref 11.5–15.5)
WBC: 9.4 10*3/uL (ref 4.0–10.5)
nRBC: 0 % (ref 0.0–0.2)

## 2022-02-27 ENCOUNTER — Encounter (HOSPITAL_COMMUNITY): Payer: Self-pay | Admitting: General Surgery

## 2022-02-27 ENCOUNTER — Other Ambulatory Visit: Payer: Self-pay

## 2022-02-27 ENCOUNTER — Ambulatory Visit (HOSPITAL_COMMUNITY): Payer: BC Managed Care – PPO | Admitting: Anesthesiology

## 2022-02-27 ENCOUNTER — Ambulatory Visit (HOSPITAL_COMMUNITY)
Admission: RE | Admit: 2022-02-27 | Discharge: 2022-02-27 | Disposition: A | Discharge status: Home / Self Care | Payer: BC Managed Care – PPO | Source: Ambulatory Visit | Attending: General Surgery | Admitting: General Surgery

## 2022-02-27 ENCOUNTER — Encounter (HOSPITAL_COMMUNITY)
Admission: RE | Disposition: A | Discharge status: Home / Self Care | Payer: Self-pay | Source: Ambulatory Visit | Attending: General Surgery

## 2022-02-27 DIAGNOSIS — F17210 Nicotine dependence, cigarettes, uncomplicated: Secondary | ICD-10-CM | POA: Diagnosis not present

## 2022-02-27 DIAGNOSIS — T792XXA Traumatic secondary and recurrent hemorrhage and seroma, initial encounter: Secondary | ICD-10-CM | POA: Diagnosis not present

## 2022-02-27 DIAGNOSIS — S8012XA Contusion of left lower leg, initial encounter: Secondary | ICD-10-CM | POA: Insufficient documentation

## 2022-02-27 HISTORY — PX: INCISION AND DRAINAGE ABSCESS: SHX5864

## 2022-02-27 SURGERY — INCISION AND DRAINAGE, ABSCESS
Anesthesia: General | Site: Leg Lower | Laterality: Left

## 2022-02-27 MED ORDER — FENTANYL CITRATE (PF) 100 MCG/2ML IJ SOLN
INTRAMUSCULAR | Status: AC
  Filled 2022-02-27: qty 2

## 2022-02-27 MED ORDER — HYDROMORPHONE HCL 1 MG/ML IJ SOLN: 0.2500 mg | INTRAMUSCULAR | Status: DC | PRN

## 2022-02-27 MED ORDER — IPRATROPIUM-ALBUTEROL 0.5-2.5 (3) MG/3ML IN SOLN
RESPIRATORY_TRACT | Status: AC
  Filled 2022-02-27: qty 3

## 2022-02-27 MED ORDER — LIDOCAINE HCL (PF) 2 % IJ SOLN
INTRAMUSCULAR | Status: AC
  Filled 2022-02-27: qty 5

## 2022-02-27 MED ORDER — CHLORHEXIDINE GLUCONATE CLOTH 2 % EX PADS: 6.0000 | MEDICATED_PAD | Freq: Once | CUTANEOUS | Status: DC

## 2022-02-27 MED ORDER — OXYCODONE-ACETAMINOPHEN 5-325 MG PO TABS
1.0000 | ORAL_TABLET | Freq: Once | ORAL | Status: AC
  Administered 2022-02-27: 1 via ORAL

## 2022-02-27 MED ORDER — SUGAMMADEX SODIUM 500 MG/5ML IV SOLN
INTRAVENOUS | Status: AC
  Filled 2022-02-27: qty 5

## 2022-02-27 MED ORDER — ONDANSETRON HCL 4 MG/2ML IJ SOLN
INTRAMUSCULAR | Status: AC
  Filled 2022-02-27: qty 2

## 2022-02-27 MED ORDER — MIDAZOLAM HCL 2 MG/2ML IJ SOLN
INTRAMUSCULAR | Status: AC
  Filled 2022-02-27: qty 2

## 2022-02-27 MED ORDER — PROPOFOL 10 MG/ML IV BOLUS
INTRAVENOUS | Status: DC | PRN
  Administered 2022-02-27: 200 mg via INTRAVENOUS

## 2022-02-27 MED ORDER — SODIUM CHLORIDE 0.9 % IR SOLN
Status: DC | PRN
  Administered 2022-02-27: 1000 mL

## 2022-02-27 MED ORDER — PROPOFOL 10 MG/ML IV BOLUS
INTRAVENOUS | Status: AC
  Filled 2022-02-27: qty 20

## 2022-02-27 MED ORDER — IPRATROPIUM-ALBUTEROL 0.5-2.5 (3) MG/3ML IN SOLN: 3.0000 mL | RESPIRATORY_TRACT | Status: DC

## 2022-02-27 MED ORDER — FENTANYL CITRATE (PF) 100 MCG/2ML IJ SOLN
INTRAMUSCULAR | Status: DC | PRN
  Administered 2022-02-27: 25 ug via INTRAVENOUS
  Administered 2022-02-27 (×2): 50 ug via INTRAVENOUS
  Administered 2022-02-27: 25 ug via INTRAVENOUS

## 2022-02-27 MED ORDER — MIDAZOLAM HCL 5 MG/5ML IJ SOLN
INTRAMUSCULAR | Status: DC | PRN
  Administered 2022-02-27: 2 mg via INTRAVENOUS

## 2022-02-27 MED ORDER — LIDOCAINE HCL (CARDIAC) PF 100 MG/5ML IV SOSY
PREFILLED_SYRINGE | INTRAVENOUS | Status: DC | PRN
  Administered 2022-02-27: 100 mg via INTRATRACHEAL

## 2022-02-27 MED ORDER — CHLORHEXIDINE GLUCONATE 0.12 % MT SOLN
15.0000 mL | Freq: Once | OROMUCOSAL | Status: AC
  Administered 2022-02-27: 15 mL via OROMUCOSAL

## 2022-02-27 MED ORDER — CEFAZOLIN SODIUM-DEXTROSE 2-4 GM/100ML-% IV SOLN
INTRAVENOUS | Status: AC
  Filled 2022-02-27: qty 100

## 2022-02-27 MED ORDER — ONDANSETRON HCL 4 MG/2ML IJ SOLN: 4.0000 mg | Freq: Once | INTRAMUSCULAR | Status: DC | PRN

## 2022-02-27 MED ORDER — OXYCODONE-ACETAMINOPHEN 5-325 MG PO TABS
ORAL_TABLET | ORAL | Status: AC
  Filled 2022-02-27: qty 1

## 2022-02-27 MED ORDER — PHENYLEPHRINE 80 MCG/ML (10ML) SYRINGE FOR IV PUSH (FOR BLOOD PRESSURE SUPPORT)
PREFILLED_SYRINGE | INTRAVENOUS | Status: AC
  Filled 2022-02-27: qty 10

## 2022-02-27 MED ORDER — ESMOLOL HCL 100 MG/10ML IV SOLN
INTRAVENOUS | Status: AC
  Filled 2022-02-27: qty 10

## 2022-02-27 MED ORDER — ONDANSETRON HCL 4 MG PO TABS: 4.0000 mg | ORAL_TABLET | Freq: Three times a day (TID) | ORAL | 1 refills | Status: DC | PRN

## 2022-02-27 MED ORDER — DEXAMETHASONE SODIUM PHOSPHATE 10 MG/ML IJ SOLN
INTRAMUSCULAR | Status: DC | PRN
  Administered 2022-02-27: 10 mg via INTRAVENOUS

## 2022-02-27 MED ORDER — LACTATED RINGERS IV SOLN: INTRAVENOUS | Status: DC

## 2022-02-27 MED ORDER — ROCURONIUM BROMIDE 10 MG/ML (PF) SYRINGE
PREFILLED_SYRINGE | INTRAVENOUS | Status: DC | PRN
  Administered 2022-02-27: 60 mg via INTRAVENOUS

## 2022-02-27 MED ORDER — ONDANSETRON HCL 4 MG/2ML IJ SOLN
INTRAMUSCULAR | Status: DC | PRN
  Administered 2022-02-27: 4 mg via INTRAVENOUS

## 2022-02-27 MED ORDER — MEPERIDINE HCL 50 MG/ML IJ SOLN: 6.2500 mg | INTRAMUSCULAR | Status: DC | PRN

## 2022-02-27 MED ORDER — DEXMEDETOMIDINE HCL IN NACL 80 MCG/20ML IV SOLN
INTRAVENOUS | Status: AC
  Filled 2022-02-27: qty 40

## 2022-02-27 MED ORDER — OXYCODONE HCL 5 MG PO TABS: 5.0000 mg | ORAL_TABLET | ORAL | 0 refills | Status: DC | PRN

## 2022-02-27 MED ORDER — ROCURONIUM BROMIDE 10 MG/ML (PF) SYRINGE
PREFILLED_SYRINGE | INTRAVENOUS | Status: AC
  Filled 2022-02-27: qty 10

## 2022-02-27 MED ORDER — CEFAZOLIN SODIUM-DEXTROSE 2-4 GM/100ML-% IV SOLN
2.0000 g | INTRAVENOUS | Status: AC
  Administered 2022-02-27: 2 g via INTRAVENOUS

## 2022-02-27 MED ORDER — DEXAMETHASONE SODIUM PHOSPHATE 10 MG/ML IJ SOLN
INTRAMUSCULAR | Status: AC
  Filled 2022-02-27: qty 1

## 2022-02-27 MED ORDER — PHENYLEPHRINE 80 MCG/ML (10ML) SYRINGE FOR IV PUSH (FOR BLOOD PRESSURE SUPPORT)
PREFILLED_SYRINGE | INTRAVENOUS | Status: DC | PRN
  Administered 2022-02-27: 160 ug via INTRAVENOUS
  Administered 2022-02-27 (×2): 100 ug via INTRAVENOUS
  Administered 2022-02-27 (×2): 160 ug via INTRAVENOUS

## 2022-02-27 MED ORDER — ORAL CARE MOUTH RINSE: 15.0000 mL | Freq: Once | OROMUCOSAL | Status: AC

## 2022-02-27 SURGICAL SUPPLY — 35 items
APL PRP STRL LF DISP 70% ISPRP (MISCELLANEOUS) ×1
BNDG CMPR 5X4 CHSV STRCH STRL (GAUZE/BANDAGES/DRESSINGS) ×1
BNDG CMPR MED 10X6 ELC LF (GAUZE/BANDAGES/DRESSINGS) ×1
BNDG COHESIVE 4X5 TAN STRL LF (GAUZE/BANDAGES/DRESSINGS) IMPLANT
BNDG ELASTIC 6X10 VLCR STRL LF (GAUZE/BANDAGES/DRESSINGS) IMPLANT
BNDG GAUZE DERMACEA FLUFF 4 (GAUZE/BANDAGES/DRESSINGS) IMPLANT
BNDG GZE DERMACEA 4 6PLY (GAUZE/BANDAGES/DRESSINGS) ×1
CHLORAPREP W/TINT 26 (MISCELLANEOUS) IMPLANT
CLOTH BEACON ORANGE TIMEOUT ST (SAFETY) ×1 IMPLANT
COVER LIGHT HANDLE STERIS (MISCELLANEOUS) ×2 IMPLANT
ELECT REM PT RETURN 9FT ADLT (ELECTROSURGICAL) ×1
ELECTRODE REM PT RTRN 9FT ADLT (ELECTROSURGICAL) ×1 IMPLANT
EVACUATOR DRAINAGE 10X20 100CC (DRAIN) IMPLANT
EVACUATOR SILICONE 100CC (DRAIN) ×1
GAUZE SPONGE 4X4 12PLY STRL (GAUZE/BANDAGES/DRESSINGS) ×2 IMPLANT
GLOVE BIO SURGEON STRL SZ 6.5 (GLOVE) ×1 IMPLANT
GLOVE BIOGEL PI IND STRL 6.5 (GLOVE) ×1 IMPLANT
GLOVE BIOGEL PI IND STRL 7.0 (GLOVE) ×2 IMPLANT
GLOVE SS BIOGEL STRL SZ 6.5 (GLOVE) IMPLANT
GOWN STRL REUS W/ TWL LRG LVL3 (GOWN DISPOSABLE) IMPLANT
GOWN STRL REUS W/TWL LRG LVL3 (GOWN DISPOSABLE) ×3 IMPLANT
KIT TURNOVER KIT A (KITS) ×1 IMPLANT
MANIFOLD NEPTUNE II (INSTRUMENTS) ×1 IMPLANT
NS IRRIG 1000ML POUR BTL (IV SOLUTION) ×1 IMPLANT
PACK MINOR (CUSTOM PROCEDURE TRAY) ×1 IMPLANT
PAD ABD 5X9 TENDERSORB (GAUZE/BANDAGES/DRESSINGS) IMPLANT
PAD ARMBOARD 7.5X6 YLW CONV (MISCELLANEOUS) ×1 IMPLANT
SET BASIN LINEN APH (SET/KITS/TRAYS/PACK) ×1 IMPLANT
SPONGE DRAIN TRACH 4X4 STRL 2S (GAUZE/BANDAGES/DRESSINGS) IMPLANT
SPONGE T-LAP 18X18 ~~LOC~~+RFID (SPONGE) IMPLANT
STOCKINETTE IMPERVIOUS LG (DRAPES) IMPLANT
SUT ETHILON 3 0 FSL (SUTURE) IMPLANT
SUT VIC AB 2-0 CT1 27 (SUTURE) ×1
SUT VIC AB 2-0 CT1 TAPERPNT 27 (SUTURE) IMPLANT
SYR BULB IRRIG 60ML STRL (SYRINGE) ×1 IMPLANT
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
## 2022-02-27 NOTE — Progress Notes (Signed)
[facility]  Patient did not request me to speak with family. JP drain in place. RN to show how to strip tubing and keep drain secure. RN to explain to patient how to record the output in mL (give her a cup) and the color of the output daily.  Compression dressing to remain in place for 48 hours and then replace daily. RN to give more ABD and ACE wrap.  Curlene Labrum, MD [facility] [address] [place], [place] [address] [phone] (office)

## 2022-02-27 NOTE — Interval H&P Note (Signed)
History and Physical Interval Note:  02/27/2022 11:55 AM  Brandy Rivera  has presented today for surgery, with the diagnosis of LEFT LOWER LEG SEROMA.  The various methods of treatment have been discussed with the patient and family. After consideration of risks, benefits and other options for treatment, the patient has consented to  Procedure(s): INCISION AND DRAINAGE HEMATOMA, LEFT LOWER LEG (Left) as a surgical intervention.  The patient's history has been reviewed, patient examined, no change in status, stable for surgery.  I have reviewed the patient's chart and labs.  Questions were answered to the patient's satisfaction.    Marked and discussed drain.  Virl Cagey

## 2022-02-27 NOTE — Anesthesia Postprocedure Evaluation (Signed)
Anesthesia Post Note  Patient: Dyana Magner  Procedure(s) Performed: INCISION AND DRAINAGE SEROMA, LEFT LOWER LEG, PLACEMENT OF JP DRAIN (Left: Leg Lower)  Patient location during evaluation: Phase II Anesthesia Type: General Level of consciousness: awake and alert and oriented Pain management: pain level controlled Vital Signs Assessment: post-procedure vital signs reviewed and stable Respiratory status: spontaneous breathing, nonlabored ventilation and respiratory function stable Cardiovascular status: blood pressure returned to baseline and stable Postop Assessment: no apparent nausea or vomiting Anesthetic complications: no   No notable events documented.   Last Vitals:  Vitals:   02/27/22 1430 02/27/22 1445  BP: 131/78 (!) 145/61  Pulse: 83 83  Resp: 20 13  Temp:    SpO2: 91% 92%    Last Pain:  Vitals:   02/27/22 1445  PainSc: 5                  Luciana Cammarata C Jorgia Manthei

## 2022-02-27 NOTE — Discharge Instructions (Addendum)
Keep compression dressing in place. Replace in 48 hours.  Place ACE wrap and ABD around the leg.  Ok to shower but need to place leg with drain in a trash bag and keep the incision and JP drain from getting wet. Otherwise you can birdbath.   JP drain care:  Please keep the drain clean and dry. Replace the gauze/ tape around the drain if it gets dirty or wet/ saturated. Please do not mess with or cut the stitch that is keeping the drain in place. Secure the drain to your clothes so that it does not get dislodged.  Wear an Ace around the calf to keep the drain from dislodging, especially at night.  Please record the output from the drain daily including the color and the amount in milliliters.  Please strip the drain tubing with your fingers from the start of the drain at the skin all the way to the end to prevent it from clogging.  The RN should show you how to strip the drain.  Please keep the drain covered with plastic and tape when you shower so that it does not get wet.

## 2022-02-27 NOTE — Anesthesia Preprocedure Evaluation (Addendum)
Anesthesia Evaluation  Patient identified by MRN, date of birth, ID band Patient awake    Reviewed: Allergy & Precautions, NPO status , Patient's Chart, lab work & pertinent test results  Airway Mallampati: II  TM Distance: >3 FB Neck ROM: Full    Dental  (+) Dental Advisory Given, Missing, Poor Dentition   Pulmonary Current Smoker,    Pulmonary exam normal breath sounds clear to auscultation       Cardiovascular negative cardio ROS Normal cardiovascular exam Rhythm:Regular Rate:Normal     Neuro/Psych PSYCHIATRIC DISORDERS Depression negative neurological ROS     GI/Hepatic negative GI ROS, Neg liver ROS,   Endo/Other  Morbid obesity  Renal/GU negative Renal ROS  negative genitourinary   Musculoskeletal  (+) Arthritis , Osteoarthritis,    Abdominal   Peds negative pediatric ROS (+)  Hematology  (+) Blood dyscrasia, anemia ,   Anesthesia Other Findings   Reproductive/Obstetrics negative OB ROS                             Anesthesia Physical Anesthesia Plan  ASA: 3  Anesthesia Plan: General   Post-op Pain Management: Dilaudid IV   Induction: Intravenous  PONV Risk Score and Plan: 3 and Ondansetron, Dexamethasone and Midazolam  Airway Management Planned: Oral ETT  Additional Equipment:   Intra-op Plan:   Post-operative Plan: Extubation in OR  Informed Consent: I have reviewed the patients History and Physical, chart, labs and discussed the procedure including the risks, benefits and alternatives for the proposed anesthesia with the patient or authorized representative who has indicated his/her understanding and acceptance.     Dental advisory given  Plan Discussed with: CRNA and Surgeon  Anesthesia Plan Comments:         Anesthesia Quick Evaluation

## 2022-02-27 NOTE — Transfer of Care (Signed)
Immediate Anesthesia Transfer of Care Note  Patient: Brandy Rivera  Procedure(s) Performed: INCISION AND DRAINAGE SEROMA, LEFT LOWER LEG, PLACEMENT OF JP DRAIN (Left: Leg Lower)  Patient Location: PACU  Anesthesia Type:General  Level of Consciousness: awake, alert , oriented and patient cooperative  Airway & Oxygen Therapy: Patient Spontanous Breathing and Patient connected to nasal cannula oxygen  Post-op Assessment: Report given to RN, Post -op Vital signs reviewed and stable and Patient moving all extremities  Post vital signs: Reviewed and stable  Last Vitals:  Vitals Value Taken Time  BP 133/109 02/27/22 1400  Temp 36.7 C 02/27/22 1400  Pulse 89 02/27/22 1401  Resp 12 02/27/22 1401  SpO2 93 % 02/27/22 1401  Vitals shown include unvalidated device data.  Last Pain:  Vitals:   02/27/22 1130  PainSc: 0-No pain         Complications: No notable events documented.

## 2022-02-27 NOTE — Anesthesia Procedure Notes (Signed)
Procedure Name: Intubation Date/Time: 02/27/2022 12:21 PM  Performed by: Camillia Herter, RNPre-anesthesia Checklist: Patient identified, Emergency Drugs available, Suction available and Patient being monitored Patient Re-evaluated:Patient Re-evaluated prior to induction Oxygen Delivery Method: Circle system utilized Preoxygenation: Pre-oxygenation with 100% oxygen Induction Type: IV induction Ventilation: Mask ventilation without difficulty Laryngoscope Size: Miller and 2 Grade View: Grade I Tube type: Oral Tube size: 7.0 mm Number of attempts: 1 Airway Equipment and Method: Stylet Placement Confirmation: ETT inserted through vocal cords under direct vision, positive ETCO2 and breath sounds checked- equal and bilateral Secured at: 21 cm Tube secured with: Tape Dental Injury: Teeth and Oropharynx as per pre-operative assessment

## 2022-02-28 ENCOUNTER — Encounter: Payer: Self-pay | Admitting: *Deleted

## 2022-03-01 LAB — SURGICAL PATHOLOGY

## 2022-03-09 ENCOUNTER — Encounter: Payer: Self-pay | Admitting: General Surgery

## 2022-03-09 ENCOUNTER — Ambulatory Visit (INDEPENDENT_AMBULATORY_CARE_PROVIDER_SITE_OTHER): Payer: BC Managed Care – PPO | Admitting: General Surgery

## 2022-03-09 ENCOUNTER — Encounter: Payer: Self-pay | Admitting: *Deleted

## 2022-03-09 VITALS — BP 125/77 | HR 98 | Temp 97.4°F | Resp 14 | Ht 69.0 in | Wt 259.0 lb

## 2022-03-09 DIAGNOSIS — T792XXA Traumatic secondary and recurrent hemorrhage and seroma, initial encounter: Secondary | ICD-10-CM

## 2022-03-09 NOTE — Progress Notes (Signed)
[facility]  Doing well. Serous drainage to SS over the last few days. Average recording 60-70cc  and seems to be decreasing.  BP 125/77   Pulse 98   Temp (!) 97.4 F (36.3 C) (Oral)   Resp 14   Ht '5\' 9"'$  (1.753 m)   Wt 259 lb (117.5 kg)   SpO2 95%   BMI 38.25 kg/m   Left calf incision c/d/I without erythema or drainage JP with serous output in bulb  Patient sp seroma excision. Doing well. Will see if drain output decreases.  Ok to return to work tomorrow 03/10/2022. Will see you 03/16/2022 to hopefully get the drain out.  Continue drain care and writing down the information.    Future Appointments  Date Time Provider [place]  03/16/2022  1:00 PM Virl Cagey, MD RS-RS None  07/13/2022  4:20 PM Carole Civil, MD OCR-OCR None    Curlene Labrum, MD [facility] [address] [place], [place] [address] [phone] (office)

## 2022-03-09 NOTE — Patient Instructions (Signed)
Ok to return to work tomorrow 03/10/2022. Will see you 03/16/2022 to hopefully get the drain out.  Continue drain care and writing down the information.

## 2022-03-13 ENCOUNTER — Telehealth: Payer: Self-pay | Admitting: Family Medicine

## 2022-03-13 ENCOUNTER — Encounter (HOSPITAL_COMMUNITY): Payer: Self-pay | Admitting: General Surgery

## 2022-03-13 NOTE — Telephone Encounter (Signed)
FMLA Paperwork completed and faxed to [employer] at [place] at [phone] and received confirmation.

## 2022-03-16 ENCOUNTER — Ambulatory Visit (INDEPENDENT_AMBULATORY_CARE_PROVIDER_SITE_OTHER): Payer: BC Managed Care – PPO | Admitting: General Surgery

## 2022-03-16 ENCOUNTER — Encounter: Payer: Self-pay | Admitting: General Surgery

## 2022-03-16 VITALS — BP 154/77 | HR 98 | Temp 98.5°F | Resp 14 | Ht 64.0 in | Wt 265.0 lb

## 2022-03-16 DIAGNOSIS — T792XXA Traumatic secondary and recurrent hemorrhage and seroma, initial encounter: Secondary | ICD-10-CM

## 2022-03-16 NOTE — Patient Instructions (Signed)
Wear compression ACE for next 3 days. Remove bandage over drain site in 2-3 days. Can replace if needed if saturated. Ok to shower but cover up leg for next 2-3 days while the dressing is on. Once the hole is healed ok to shower like normal. Steristrips will peel off in the next 5-7 days. You can remove them once they are peeling off. It is ok to shower. Pat the area dry.

## 2022-03-17 NOTE — Progress Notes (Signed)
[facility]  Left leg lipoma site healing, drain with serous output. JP drain and sutures removed. ACE wrap placed.   Wear compression ACE for next 3 days. Remove bandage over drain site in 2-3 days. Can replace if needed if saturated. Ok to shower but cover up leg for next 2-3 days while the dressing is on. Once the hole is healed ok to shower like normal. Steristrips will peel off in the next 5-7 days. You can remove them once they are peeling off. It is ok to shower. Pat the area dry.   Curlene Labrum, MD [facility] [address] [place], [place] [address] [phone] (office)

## 2022-03-21 ENCOUNTER — Encounter: Payer: Self-pay | Admitting: *Deleted

## 2022-03-21 NOTE — Telephone Encounter (Signed)
Surgical Date: 02/27/2022 Procedure: I&D Seroma, L Thigh  Received call from patient [phone]~ telephone.   Reports that she has noted brown colored discharge to dressing to seroma. Patient had friend take pictures and forwarded them to office via [employer].   Denies pain, fever/ chills, N/V, increased warmth to area.   Upon review of photos, noted that brown discoloration appears to be Benzoin that assists Steri Strips to adhere.   Patient states that she continues to have clear, yellow tinted drainage from area. Advised that area appears to be healing as expected and to continue slight compression to avoid fluid build up.   Verbalized understanding.   Dr. Constance Haw to be made aware.

## 2022-03-29 ENCOUNTER — Telehealth: Payer: Self-pay | Admitting: Radiology

## 2022-03-29 NOTE — Telephone Encounter (Signed)
Patient called, LMVM asking for a cd of images to be mailed to [facility] in [place].  Will call patient for info on where to send once cd burned.  Lsp and bil knees images from 03/31/2021.

## 2022-03-30 ENCOUNTER — Encounter: Payer: Self-pay | Admitting: Radiology

## 2022-04-12 MED ORDER — DOXYCYCLINE HYCLATE 100 MG PO TBEC: 100.0000 mg | DELAYED_RELEASE_TABLET | Freq: Two times a day (BID) | ORAL | 0 refills | Status: AC

## 2022-05-03 ENCOUNTER — Encounter: Payer: Self-pay | Admitting: General Surgery

## 2022-05-03 ENCOUNTER — Ambulatory Visit (INDEPENDENT_AMBULATORY_CARE_PROVIDER_SITE_OTHER): Payer: BC Managed Care – PPO | Admitting: General Surgery

## 2022-05-03 ENCOUNTER — Other Ambulatory Visit: Payer: Self-pay

## 2022-05-03 VITALS — BP 141/81 | HR 87 | Temp 98.4°F | Resp 14 | Ht 64.0 in | Wt 259.0 lb

## 2022-05-03 DIAGNOSIS — T8131XA Disruption of external operation (surgical) wound, not elsewhere classified, initial encounter: Secondary | ICD-10-CM | POA: Diagnosis not present

## 2022-05-03 DIAGNOSIS — T792XXA Traumatic secondary and recurrent hemorrhage and seroma, initial encounter: Secondary | ICD-10-CM | POA: Diagnosis not present

## 2022-05-03 NOTE — Patient Instructions (Signed)
Replace dressing as needed for first few days. After that as long as drainage staying on the pad, can replace the pad and bandage daily. Keep some compression on the area and a pad (kotex pads are cheapest).  Call with any changes or redness you note.  Expect some greyish drainage from the silver nitrate stick.

## 2022-05-03 NOTE — Progress Notes (Signed)
[facility]  Patient had seroma evacuated and cavity excised 10/30. She had a drain in place for 2 weeks. This area has not fully healed and the incision site that is open has continued to drain serous fluid.  BP (!) 141/81   Pulse 87   Temp 98.4 F (36.9 C) (Oral)   Resp 14   Ht '5\' 4"'$  (1.626 m)   Wt 259 lb (117.5 kg)   SpO2 96%   BMI 44.46 kg/m   Partial incision wound dehiscence with serous fluid, silver nitrate to the cavity and edges of the wound, seroma evacuated, penrose drain placed to help evacuate fluid, pressure dressing, no surrounding erythema   Patient with partial wound dehiscence likely from seroma build up that evacuated itself. Will attempt penrose and back it out to get the cavity to close down and if that does not work will need packing. She has limited ability to get to the back of her calf so will do that next if this does not resolve.  Replace dressing as needed for first few days. After that as long as drainage staying on the pad, can replace the pad and bandage daily. Keep some compression on the area and a pad (kotex pads are cheapest).  Call with any changes or redness you note.  Expect some greyish drainage from the silver nitrate stick.   Future Appointments  Date Time Provider [place]  05/09/2022 10:00 AM Virl Cagey, MD RS-RS None  07/13/2022  4:20 PM Carole Civil, MD OCR-OCR None

## 2022-05-04 MED ORDER — OXYCODONE HCL 5 MG PO TABS: 5.0000 mg | ORAL_TABLET | ORAL | 0 refills | Status: DC | PRN

## 2022-05-04 NOTE — Addendum Note (Signed)
Addended by: Curlene Labrum on: 05/04/2022 03:40 PM   Modules accepted: Orders

## 2022-05-09 ENCOUNTER — Ambulatory Visit: Payer: BC Managed Care – PPO | Admitting: General Surgery

## 2022-05-16 ENCOUNTER — Ambulatory Visit: Payer: BC Managed Care – PPO | Admitting: General Surgery

## 2022-05-17 ENCOUNTER — Encounter: Payer: Self-pay | Admitting: General Surgery

## 2022-05-17 ENCOUNTER — Ambulatory Visit: Payer: BC Managed Care – PPO | Admitting: General Surgery

## 2022-05-17 VITALS — BP 134/82 | HR 97 | Temp 98.4°F | Resp 14 | Ht 64.0 in | Wt 255.0 lb

## 2022-05-17 DIAGNOSIS — T792XXA Traumatic secondary and recurrent hemorrhage and seroma, initial encounter: Secondary | ICD-10-CM

## 2022-05-17 DIAGNOSIS — T8131XA Disruption of external operation (surgical) wound, not elsewhere classified, initial encounter: Secondary | ICD-10-CM

## 2022-05-17 MED ORDER — DOXYCYCLINE MONOHYDRATE 50 MG PO CAPS: 50.0000 mg | ORAL_CAPSULE | Freq: Two times a day (BID) | ORAL | 0 refills | Status: AC

## 2022-05-17 NOTE — Progress Notes (Signed)
[facility]  Penrose drain has sunken into the incision. She has drainage but does think the area is flatter and the swelling is improving.  No fevers or redness around the incision.  BP 134/82   Pulse 97   Temp 98.4 F (36.9 C) (Oral)   Resp 14   Ht '5\' 4"'$  (1.626 m)   Wt 255 lb (115.7 kg)   SpO2 97%   BMI 43.77 kg/m  Incision probed and penrose and suture removed, purulence looking drainage Saline irrigated into the area  Peroxide on qtip placed into the cavity, cavity has collapsed to likely 4X2cm size which is at least 1/3 of the size originally   Area prepped with betadine and lidocaine injected. Penrose replaced and cut half the length of prior, secured with 3-0 nylon suture  Patient with a seroma and wound dehiscence. Penrose to remain in place. She is unable and has no one to pack this wound.  Doxycycline for purulence drainage Call with issues or changes. Drain should drain less. Take the antibiotic. Call if the drain gets lost in the incision or if it falls out completely.   Future Appointments  Date Time Provider [place]  06/06/2022 11:45 AM Virl Cagey, MD RS-RS None  07/13/2022  4:20 PM Carole Civil, MD OCR-OCR None   Curlene Labrum, MD [facility] [address] [place], [place] [address] [phone] (office)

## 2022-05-17 NOTE — Patient Instructions (Signed)
Call with issues or changes. Drain should drain less. Take the antibiotic. Call if the drain gets lost in the incision or if it falls out completely.

## 2022-06-06 ENCOUNTER — Encounter: Payer: Self-pay | Admitting: General Surgery

## 2022-06-06 ENCOUNTER — Ambulatory Visit: Payer: BC Managed Care – PPO | Admitting: General Surgery

## 2022-06-06 VITALS — BP 145/78 | HR 94 | Temp 98.2°F | Resp 16 | Ht 64.0 in | Wt 260.0 lb

## 2022-06-06 DIAGNOSIS — T792XXA Traumatic secondary and recurrent hemorrhage and seroma, initial encounter: Secondary | ICD-10-CM

## 2022-06-06 DIAGNOSIS — T8131XA Disruption of external operation (surgical) wound, not elsewhere classified, initial encounter: Secondary | ICD-10-CM | POA: Diagnosis not present

## 2022-06-06 NOTE — Progress Notes (Signed)
[facility]  Future Appointments  Date Time Provider [place]  06/22/2022  2:45 PM Virl Cagey, MD RS-RS None  07/17/2022  4:45 PM Carole Civil, MD OCR-OCR None

## 2022-06-06 NOTE — Patient Instructions (Signed)
Try to do peroxide in the wound daily. It is ok if you cannot. If you cannot inject 10-20cc then try to probe it with a qtip with peroxide on the tip.  This should stop draining and close up. If it stops and you feel like it is sealed up you can cancel the appt.

## 2022-06-22 ENCOUNTER — Ambulatory Visit: Payer: BC Managed Care – PPO | Admitting: General Surgery

## 2022-06-29 ENCOUNTER — Encounter: Payer: Self-pay | Admitting: Radiology

## 2022-07-13 ENCOUNTER — Ambulatory Visit: Payer: BC Managed Care – PPO | Admitting: Orthopedic Surgery

## 2022-07-17 ENCOUNTER — Ambulatory Visit: Payer: BC Managed Care – PPO | Admitting: Orthopedic Surgery

## 2022-07-17 ENCOUNTER — Encounter: Payer: Self-pay | Admitting: Orthopedic Surgery

## 2022-07-17 VITALS — Ht 64.0 in | Wt 260.0 lb

## 2022-07-17 DIAGNOSIS — M170 Bilateral primary osteoarthritis of knee: Secondary | ICD-10-CM

## 2022-07-17 MED ORDER — METHYLPREDNISOLONE ACETATE 40 MG/ML IJ SUSP
40.0000 mg | Freq: Once | INTRAMUSCULAR | Status: AC
  Administered 2022-07-17: 40 mg via INTRA_ARTICULAR

## 2022-07-17 MED ORDER — MELOXICAM 7.5 MG PO TABS: 7.5000 mg | ORAL_TABLET | Freq: Every day | ORAL | 5 refills | Status: DC
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
## 2022-07-17 NOTE — Patient Instructions (Signed)
We will check which brand of Hyaluronic acid injections (there are several) your insurance covers. We will call you with price and schedule with you if insurance approves and you are okay with the out of pocket costs. If for any reason they will not cover or the out of pocket costs are high, we will discuss with you and let you know other options available. This process normally takes several weeks to hear back from [place] the insurance approval process takes time.

## 2022-07-18 ENCOUNTER — Telehealth: Payer: Self-pay

## 2022-07-18 NOTE — Telephone Encounter (Signed)
-----   Message from Candice Camp, RT sent at 07/17/2022  5:03 PM EDT ----- Hyaluronic acid injections bilateral knees

## 2022-07-18 NOTE — Telephone Encounter (Signed)
VOB submitted for Orthovisc, bilateral knee 

## 2022-08-21 ENCOUNTER — Telehealth: Payer: Self-pay

## 2022-08-21 NOTE — Telephone Encounter (Signed)
PA has been submitted for Orthovisc, bilateral knee through Covermymeds. PA pending- [id number]

## 2022-08-29 ENCOUNTER — Telehealth: Payer: Self-pay

## 2022-08-29 NOTE — Telephone Encounter (Signed)
Please schedule patient for gel injections with Dr. Romeo Apple.  All information is located under the referrals tab.

## 2022-08-29 NOTE — Telephone Encounter (Signed)
Noted  

## 2022-09-07 ENCOUNTER — Encounter: Payer: Self-pay | Admitting: Orthopedic Surgery

## 2022-09-07 ENCOUNTER — Ambulatory Visit: Payer: BC Managed Care – PPO | Admitting: Orthopedic Surgery

## 2022-09-07 VITALS — Ht 64.0 in | Wt 251.0 lb

## 2022-09-07 DIAGNOSIS — G8929 Other chronic pain: Secondary | ICD-10-CM

## 2022-09-07 DIAGNOSIS — M170 Bilateral primary osteoarthritis of knee: Secondary | ICD-10-CM

## 2022-09-07 DIAGNOSIS — M545 Low back pain, unspecified: Secondary | ICD-10-CM

## 2022-09-07 MED ORDER — HYALURONAN 30 MG/2ML IX SOSY
30.0000 mg | PREFILLED_SYRINGE | Freq: Once | INTRA_ARTICULAR | Status: AC
  Administered 2022-09-14: 30 mg via INTRA_ARTICULAR

## 2022-09-07 NOTE — Progress Notes (Signed)
Chief Complaint  Patient presents with   Injections    Bilateral knees orthovisc     Encounter Diagnoses  Name Primary?   Lumbar pain Yes   Primary osteoarthritis of both knees    Chronic pain of both knees     The patient has consented to and requested hyaluronic acid injection in the form of  orthovisc   The right  knee is prepped with alcohol and ethyl chloride  The injection is performed with a 21-gauge needle, via inferolateral approach  No complications were noted  Appropriate instructions post injection were given   The patient has consented to and requested hyaluronic acid injection in the form of  Orthovisc    The left  knee is prepped with alcohol and ethyl chloride  The injection is performed with a 21-gauge needle, via inferolateral approach  No complications were noted  Appropriate instructions post injection were given   Addendum  62 year old female acute onset of lower back pain went to one of the urgent care centers received steroids and a muscle relaxer and then had x-rays done at her primary care doctor's office and they indicated that the degenerative disc disease in the lumbar spine have gotten worse  The patient completely recovered although improved on the steroids and muscle relaxer  Recommend physical therapy and [facility] are

## 2022-09-07 NOTE — Patient Instructions (Signed)
We will send orders for physical therapy to [facility] if you do not hear anything by Tuesday or Wednesday give them a call to schedule

## 2022-09-14 ENCOUNTER — Ambulatory Visit: Payer: BC Managed Care – PPO | Admitting: Orthopedic Surgery

## 2022-09-14 DIAGNOSIS — M170 Bilateral primary osteoarthritis of knee: Secondary | ICD-10-CM

## 2022-09-14 NOTE — Progress Notes (Signed)
Chief Complaint  Patient presents with   Knee Problem    Second round of hyaluronic acid injections both knees   Encounter Diagnosis  Name Primary?   Primary osteoarthritis of both knees Yes    The patient has consented to and requested hyaluronic acid injection in the form of  Orthovisc   The right  knee is prepped with alcohol and ethyl chloride  The injection is performed with a 21-gauge needle, via inferolateral approach  No complications were noted  Appropriate instructions post injection were given   The patient has consented to and requested hyaluronic acid injection in the form of  Orthovisc   The left knee is prepped with alcohol and ethyl chloride  The injection is performed with a 21-gauge needle, via inferolateral approach  No complications were noted  Appropriate instructions post injection were given

## 2022-09-21 ENCOUNTER — Ambulatory Visit: Payer: BC Managed Care – PPO | Admitting: Orthopedic Surgery

## 2022-09-21 DIAGNOSIS — M170 Bilateral primary osteoarthritis of knee: Secondary | ICD-10-CM | POA: Diagnosis not present

## 2022-09-21 MED ORDER — HYALURONAN 30 MG/2ML IX SOSY
30.0000 mg | PREFILLED_SYRINGE | Freq: Once | INTRA_ARTICULAR | Status: AC
  Administered 2022-09-21: 30 mg via INTRA_ARTICULAR

## 2022-09-21 NOTE — Progress Notes (Signed)
Chief Complaint  Patient presents with   Injections    Bilateral knees Orthovisc #3    Encounter Diagnosis  Name Primary?   Primary osteoarthritis of both knees Yes    The patient has consented to and requested hyaluronic acid injection  The left knee is prepped with alcohol and ethyl chloride  The injection is performed with a 21-gauge needle, via inferolateral approach  No complications were noted  Appropriate instructions post injection were given   The patient has consented to and requested hyaluronic acid injection   The right  knee is prepped with alcohol and ethyl chloride  The injection is performed with a 21-gauge needle, via inferolateral approach  No complications were noted  Appropriate instructions post injection were given

## 2022-11-08 ENCOUNTER — Ambulatory Visit (INDEPENDENT_AMBULATORY_CARE_PROVIDER_SITE_OTHER): Payer: BC Managed Care – PPO | Admitting: General Surgery

## 2022-11-08 ENCOUNTER — Encounter: Payer: Self-pay | Admitting: General Surgery

## 2022-11-08 VITALS — BP 131/84 | HR 87 | Temp 97.6°F | Resp 14 | Ht 64.0 in | Wt 243.0 lb

## 2022-11-08 DIAGNOSIS — T8131XA Disruption of external operation (surgical) wound, not elsewhere classified, initial encounter: Secondary | ICD-10-CM

## 2022-11-08 NOTE — Patient Instructions (Signed)
Will excise the chronic area of dehiscence (opening) and tunneling/ infection.   Tylenol 1000 mg 30 min before arrival.

## 2022-11-08 NOTE — Progress Notes (Signed)
[facility]  Wound on the left posterior calf still with open area from the prior dehiscence and has been draining at times. She went to the ED at [facility] and they packed it but did not give her antibiotics.  BP 131/84   Pulse 87   Temp 97.6 F (36.4 C) (Oral)   Resp 14   Ht 5\' 4"  (1.626 m)   Wt 243 lb (110.2 kg)   SpO2 94%   BMI 41.71 kg/m  1cm opening posterior wound, no active drainage No redness Probed with qtip cannot get passed skin tunnel  Patient s/p incision and drainage of traumatic seroma that had been there for years. She had a wound dehiscence after and area still has not closed. The tunnel has epithelized.I think she has some chronic indolent bacteria in the wound that is unable to seal up due to the epithelization.   Plan for ellipse of the skin and opening the wound and get this area open and cleaned out. Plan to heal by secondary intention and packing.    Future Appointments  Date Time Provider Department Center  11/09/2022  8:30 AM Lucretia Roers, MD RS-RS None   Algis Greenhouse, MD [facility] [address] [place], [place] [address] [phone] (office)

## 2022-11-09 ENCOUNTER — Encounter: Payer: Self-pay | Admitting: Family Medicine

## 2022-11-09 ENCOUNTER — Ambulatory Visit: Payer: BC Managed Care – PPO | Admitting: General Surgery

## 2022-11-09 ENCOUNTER — Encounter: Payer: Self-pay | Admitting: *Deleted

## 2022-11-09 ENCOUNTER — Encounter: Payer: Self-pay | Admitting: General Surgery

## 2022-11-09 VITALS — BP 125/79 | HR 90 | Temp 98.0°F | Resp 14 | Ht 64.0 in | Wt 243.0 lb

## 2022-11-09 DIAGNOSIS — L905 Scar conditions and fibrosis of skin: Secondary | ICD-10-CM | POA: Diagnosis not present

## 2022-11-09 DIAGNOSIS — L089 Local infection of the skin and subcutaneous tissue, unspecified: Secondary | ICD-10-CM

## 2022-11-09 DIAGNOSIS — T8131XA Disruption of external operation (surgical) wound, not elsewhere classified, initial encounter: Secondary | ICD-10-CM | POA: Diagnosis not present

## 2022-11-09 MED ORDER — OXYCODONE HCL 5 MG PO TABS: 5.0000 mg | ORAL_TABLET | ORAL | 0 refills | Status: AC | PRN

## 2022-11-09 NOTE — Progress Notes (Signed)
[facility] Procedure Note  11/09/22  Pre-procedure Diagnosis: Infected incision with dehiscence    Post-procedure Diagnosis: Same   Procedure(s) Performed: Excision of scar, debridement of wound 2cm sq   Surgeon: Brandy Rivera. Brandy Leber, MD   Assistants: No qualified resident was available    Anesthesia: Lidocaine 1%   Specimens: Scar    Estimated Blood Loss: Minimal  Wound Class: Contaminated    Procedure Indications: Brandy Rivera is a 62 yo who had a chronic traumatic seroma drained and the cavity excised 01/2022. She had issues with drainage from the wound after the JP drain was removed and an area of dehiscence of the central scar. This has now epithelized and she has purulent appearing drainage at times and has been on antibiotics. Given this I think she has some nidus for infection in the wound where the skin epithelized.  We discussed excision of the scar and debridement of the tissue beneath with packing to close secondarily so that we do not have this issue in the future.   Findings: No purulent drainage, cavity with scar tissue, track down about 2cm deep   Procedure: The patient was taken to the procedure room and placed on her back. The left calf was prepared and draped in the usual sterile fashion. Lidocaine 1% was injected around the prior incision.  I eclipsed out the old scar including the area of epithelized dehiscence, and excised the scar with a sharp scalpel. There was scar and induration inferiorly but on purulent drainage. I probed the area and with sharp dissection with scissors to remove any scar that could harbor infection. The area was then irrigated copiously and packed with 1/4 inch packing. The area was covered with gauze and paper tape. Total debridement area was 2X2cm.   Final inspection revealed acceptable hemostasis. The patient tolerated the procedure well.   Instructions:  Pack wound daily or twice daily. Can cleanse area with saline and  then pack. Cover with gauze and paper-tape. Can shower but do this before you you repack. After showering then redo your packing and dressing.   Send photos of the wound every 2 weeks or so and let us know if there are issues.  Tylenol or ibuprofen for pain. Roxicodone for breakthrough pain.   Brandy Greenhouse, MD [facility] [address] [place], [place] [address] [phone] (office)

## 2022-11-09 NOTE — Patient Instructions (Signed)
Pack wound daily or twice daily. Can cleanse area with saline and then pack. Cover with gauze and paper-tape. Can shower but do this before you you repack. After showering then redo your packing and dressing.   Send photos of the wound every 2 weeks or so and let us know if there are issues.  Tylenol or ibuprofen for pain. Roxicodone for breakthrough pain.

## 2022-11-14 LAB — PATHOLOGY REPORT

## 2022-11-14 LAB — TISSUE SPECIMEN

## 2022-11-21 NOTE — Progress Notes (Signed)
Normal scar noted on pathology.

## 2023-02-02 ENCOUNTER — Other Ambulatory Visit: Payer: Self-pay | Admitting: Orthopedic Surgery

## 2023-02-02 ENCOUNTER — Telehealth: Payer: Self-pay | Admitting: Orthopedic Surgery

## 2023-02-02 DIAGNOSIS — G8929 Other chronic pain: Secondary | ICD-10-CM

## 2023-02-02 MED ORDER — INDOMETHACIN 25 MG PO CAPS: 25.0000 mg | ORAL_CAPSULE | Freq: Three times a day (TID) | ORAL | 0 refills | Status: AC

## 2023-02-02 NOTE — Telephone Encounter (Signed)
I called her told her to expect a call from [facility] for the appointment and Dr Romeo Apple sent in the meds, told her if she doesn't get call from [facility] soon to call back to schedule on Monday  She voiced understanding.

## 2023-02-02 NOTE — Telephone Encounter (Signed)
Ok to make appointment for next available for knee fluid, pain  Also to Dr Rexene Edison to see if he can send anything in before the visit.

## 2023-02-02 NOTE — Progress Notes (Signed)
Meds ordered this encounter  Medications   indomethacin (INDOCIN) 25 MG capsule    Sig: Take 1 capsule (25 mg total) by mouth 3 (three) times daily with meals.    Dispense:  30 capsule    Refill:  0

## 2023-02-02 NOTE — Telephone Encounter (Signed)
I called in indocin   Schedule appt the 17th or 18 th

## 2023-02-02 NOTE — Telephone Encounter (Signed)
DR. Romeo Apple   Patient called and left a message states her knees hurt so bad she wants to cut them off - thinks she has fluid and would like Dr. Romeo Apple to send something in for the fluid ----  per [facility]   I called the patient back and she wants to know if there is anything she can take for her knees.   Please call her back at [phone]

## 2023-02-15 ENCOUNTER — Other Ambulatory Visit (INDEPENDENT_AMBULATORY_CARE_PROVIDER_SITE_OTHER): Payer: BC Managed Care – PPO

## 2023-02-15 ENCOUNTER — Encounter: Payer: Self-pay | Admitting: Orthopedic Surgery

## 2023-02-15 ENCOUNTER — Other Ambulatory Visit: Payer: Self-pay

## 2023-02-15 ENCOUNTER — Ambulatory Visit: Payer: BC Managed Care – PPO | Admitting: Orthopedic Surgery

## 2023-02-15 VITALS — BP 121/77 | HR 93 | Ht 64.0 in | Wt 249.0 lb

## 2023-02-15 DIAGNOSIS — M170 Bilateral primary osteoarthritis of knee: Secondary | ICD-10-CM

## 2023-02-15 DIAGNOSIS — Z6841 Body Mass Index (BMI) 40.0 and over, adult: Secondary | ICD-10-CM | POA: Diagnosis not present

## 2023-02-15 NOTE — Patient Instructions (Addendum)
Goal weight is 225lbs.   Smoking Tobacco Information, Adult Smoking tobacco can be harmful to your health. Tobacco contains a toxic colorless chemical called nicotine. Nicotine causes changes in your brain that make you want more and more. This is called addiction. This can make it hard to stop smoking once you start. Tobacco also has other toxic chemicals that can hurt your body and raise your risk of many cancers. Menthol or "lite" tobacco or cigarette brands are not safer than regular brands. How can smoking tobacco affect me? Smoking tobacco puts you at risk for: Cancer. Smoking is most commonly associated with lung cancer, but can also lead to cancer in other parts of the body. Chronic obstructive pulmonary disease (COPD). This is a long-term lung condition that makes it hard to breathe. It also gets worse over time. High blood pressure (hypertension), heart disease, stroke, heart attack, and lung infections, such as pneumonia. Cataracts. This is when the lenses in the eyes become clouded. Digestive problems. This may include peptic ulcers, heartburn, and gastroesophageal reflux disease (GERD). Oral health problems, such as gum disease, mouth sores, and tooth loss. Loss of taste and smell. Smoking also affects how you look and smell. Smoking may cause: Wrinkles. Yellow or stained teeth, fingers, and fingernails. Bad breath. Bad-smelling clothes and hair. Smoking tobacco can also affect your social life, because: It may be challenging to find places to smoke when away from home. Many workplaces, [employer], hotels, and public places are tobacco-free. Smoking is expensive. This is due to the cost of tobacco and the long-term costs of treating health problems from smoking. Secondhand smoke may affect those around you. Secondhand smoke can cause lung cancer, breathing problems, and heart disease. Children of smokers have a higher risk for: Sudden infant death syndrome (SIDS). Ear  infections. Lung infections. What actions can I take to prevent health problems? Quit smoking  Do not start smoking. Quit if you already smoke. Do not replace cigarette smoking with vaping devices, such as e-cigarettes. Make a plan to quit smoking and commit to it. Look for programs to help you, and ask your health care provider for recommendations and ideas. Set a date and write down all the reasons you want to quit. Let your friends and family know you are quitting so they can help and support you. Consider finding friends who also want to quit. It can be easier to quit with someone else, so that you can support each other. Talk with your health care provider about using nicotine replacement medicines to help you quit. These include gum, lozenges, patches, sprays, or pills. If you try to quit but return to smoking, stay positive. It is common to slip up when you first quit, so take it one day at a time. Be prepared for cravings. When you feel the urge to smoke, chew gum or suck on hard candy. Lifestyle Stay busy. Take care of your body. Get plenty of exercise, eat a healthy diet, and drink plenty of water. Find ways to manage your stress, such as meditation, yoga, exercise, or time spent with friends and family. Ask your health care provider about having regular tests (screenings) to check for cancer. This may include blood tests, imaging tests, and other tests. Where to find support To get support to quit smoking, consider: Asking your health care provider for more information and resources. Joining a support group for people who want to quit smoking in your local community. There are many effective programs that may help you  to quit. Calling the smokefree.gov counselor helpline at 1-800-QUIT-NOW (503)295-1908). Where to find more information You may find more information about quitting smoking from: Centers for Disease Control and Prevention: http://www.osborne.com/ [employer]:  smokefree.gov American Lung Association: freedomfromsmoking.org Contact a health care provider if: You have problems breathing. Your lips, nose, or fingers turn blue. You have chest pain. You are coughing up blood. You feel like you will faint. You have other health changes that cause you to worry. Summary Smoking tobacco can negatively affect your health, the health of those around you, your finances, and your social life. Do not start smoking. Quit if you already smoke. If you need help quitting, ask your health care provider. Consider joining a support group for people in your local community who want to quit smoking. There are many effective programs that may help you to quit. This information is not intended to replace advice given to you by your health care provider. Make sure you discuss any questions you have with your health care provider. Document Revised: 04/12/2021 Document Reviewed: 04/12/2021 Elsevier Patient Education  2024 [employer].

## 2023-02-15 NOTE — Progress Notes (Signed)
   VISIT TYPE: FOLLOW UP   Chief Complaint  Patient presents with   Knee Pain    Bilateral knee pain/the pain is getting worse.    Encounter Diagnosis  Name Primary?   Primary osteoarthritis of both knees Yes    Assessment and Plan: Brandy Rivera looks like she is progressing towards knee arthroplasty but she needs to lose 25 pounds that will get her BMI to 37.8 which because she has been suffering so long we will go ahead and do she is not diabetic she does have hypertension she has tried pretty much everything available  HPI: 62 year old female with hypertension previously received hyaluronic acid injections for knee pain without any relief previously NSAIDs reviewed including Indocin no relief  Her activities of daily  Presents for follow-up  Brandy Rivera is noted to have grade 4 disease on x-ray see report  Her BMI is not amenable to surgery she is also a smoker  Her surgery will be complicated full of risks including but not limited to  DVT secondary to chronic leg edema Wound infection secondary to moderate obesity  Recommend 25 pound weight loss, smoking cessation, check in with [place] when she has met criteria       BP 121/77   Pulse 93   Ht 5\' 4"  (1.626 m)   Wt 249 lb (112.9 kg)   BMI 42.74 kg/m    ADLs have been tolerable as long as she takes a break  She does limp all the time and she does have some difficulty washing dishes standing up states is this related to spinal stenosis)  Imaging  X-rays show left knee varus bone-on-bone grade 4 disease large osteophytes surrounding all 3 joints primarily medially sclerosis in the subchondral bone  Right knee is in varus as well near bone-on-bone maybe 1 to 2 mm separation subchondral sclerosis smaller osteophytes than left but 3 compartment disease  A/P Encounter Diagnosis  Name Primary?   Primary osteoarthritis of both knees Yes    No orders of the defined types were placed in this encounter.

## 2024-05-15 ENCOUNTER — Ambulatory Visit: Admitting: Orthopedic Surgery

## 2024-06-02 ENCOUNTER — Ambulatory Visit: Admitting: Orthopedic Surgery
